# Patient Record
Sex: Male | Born: 1995 | Hispanic: No | Marital: Single | Smoking: Current every day smoker
Health system: Southern US, Community
[De-identification: ages and names within clinical notes are randomized; demographics above are authoritative.]

## PROBLEM LIST (undated history)

## (undated) DIAGNOSIS — F209 Schizophrenia, unspecified: Secondary | ICD-10-CM

---
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
## 2013-02-20 ENCOUNTER — Emergency Department (HOSPITAL_COMMUNITY): Payer: Self-pay

## 2013-02-20 ENCOUNTER — Emergency Department (HOSPITAL_COMMUNITY)
Admission: EM | Admit: 2013-02-20 | Discharge: 2013-02-20 | Disposition: A | Discharge status: Home / Self Care | Payer: Self-pay | Attending: Emergency Medicine | Admitting: Emergency Medicine

## 2013-02-20 ENCOUNTER — Encounter (HOSPITAL_COMMUNITY): Payer: Self-pay | Admitting: *Deleted

## 2013-02-20 DIAGNOSIS — K219 Gastro-esophageal reflux disease without esophagitis: Secondary | ICD-10-CM | POA: Insufficient documentation

## 2013-02-20 DIAGNOSIS — F17200 Nicotine dependence, unspecified, uncomplicated: Secondary | ICD-10-CM | POA: Insufficient documentation

## 2013-02-20 DIAGNOSIS — R1011 Right upper quadrant pain: Secondary | ICD-10-CM | POA: Insufficient documentation

## 2013-02-20 LAB — CBC WITH DIFFERENTIAL/PLATELET
Basophils Relative: 0 % (ref 0–1)
Eosinophils Absolute: 0.1 10*3/uL (ref 0.0–0.7)
MCH: 30.3 pg (ref 26.0–34.0)
MCHC: 37.4 g/dL — ABNORMAL HIGH (ref 30.0–36.0)
Neutrophils Relative %: 49 % (ref 43–77)
Platelets: 243 10*3/uL (ref 150–400)

## 2013-02-20 LAB — LIPASE, BLOOD: Lipase: 24 U/L (ref 11–59)

## 2013-02-20 LAB — COMPREHENSIVE METABOLIC PANEL
ALT: 20 U/L (ref 0–53)
Albumin: 4.5 g/dL (ref 3.5–5.2)
Alkaline Phosphatase: 74 U/L (ref 39–117)
Potassium: 3.3 mEq/L — ABNORMAL LOW (ref 3.5–5.1)
Sodium: 130 mEq/L — ABNORMAL LOW (ref 135–145)
Total Protein: 8.3 g/dL (ref 6.0–8.3)

## 2013-02-20 MED ORDER — FAMOTIDINE IN NACL 20-0.9 MG/50ML-% IV SOLN
20.0000 mg | Freq: Once | INTRAVENOUS | Status: AC
  Administered 2013-02-20: 20 mg via INTRAVENOUS
  Filled 2013-02-20: qty 50

## 2013-02-20 MED ORDER — POTASSIUM CHLORIDE CRYS ER 20 MEQ PO TBCR
40.0000 meq | EXTENDED_RELEASE_TABLET | Freq: Once | ORAL | Status: AC
  Administered 2013-02-20: 40 meq via ORAL
  Filled 2013-02-20: qty 2

## 2013-02-20 MED ORDER — SUCRALFATE 1 G PO TABS: 1.0000 g | ORAL_TABLET | Freq: Four times a day (QID) | ORAL | Status: DC

## 2013-02-20 MED ORDER — FAMOTIDINE 20 MG PO TABS: 20.0000 mg | ORAL_TABLET | Freq: Two times a day (BID) | ORAL | Status: DC

## 2013-02-20 MED ORDER — SODIUM CHLORIDE 0.9 % IV SOLN: INTRAVENOUS | Status: DC

## 2013-02-20 MED ORDER — SODIUM CHLORIDE 0.9 % IV BOLUS (SEPSIS)
1000.0000 mL | Freq: Once | INTRAVENOUS | Status: AC
  Administered 2013-02-20: 1000 mL via INTRAVENOUS

## 2013-02-20 MED ORDER — SODIUM CHLORIDE 0.9 % IV BOLUS (SEPSIS): 1000.0000 mL | Freq: Once | INTRAVENOUS | Status: DC

## 2013-02-20 MED ORDER — ONDANSETRON HCL 4 MG/2ML IJ SOLN
4.0000 mg | Freq: Once | INTRAMUSCULAR | Status: AC
  Administered 2013-02-20: 4 mg via INTRAVENOUS
  Filled 2013-02-20: qty 2

## 2013-02-20 NOTE — ED Notes (Signed)
Pt states vomiting Wed and Thursday. States heaving today. Also states pain to right lower rib area. Pt ate breakfast at 0630 this morning but denies vomiting, only heaving.

## 2013-02-20 NOTE — ED Provider Notes (Signed)
CSN: [id number]     Arrival date & time 02/20/13  4098 History  This chart was scribed for Toy Baker, MD by Karle Plumber and Ardelia Mems, ED Scribes. This patient was seen in room APA08/APA08 and the patient's care was started at 9:08 AM.    Chief Complaint  Patient presents with  . Emesis   The history is provided by the patient. No language interpreter was used.   HPI Comments:  William [name] is a 17 y.o. male without significant PMH who presents to the Emergency Department complaining of constant , mild, RUQ abdominal pain onset 2 days ago. He reports associated nausea and several episodes of emesis for the past two days. Pt reports eating and drinking make the emesis worse ( especially spicy foods), however, he states that certain foods do not seem to be worse than others. Pt reports taking an ibuprofen 800 mg 2 days ago without relief of pain.  He denies having any history of similar abdominal pain. He denies any surgical abdominal history. He denies fever, diarrhea, dysuria, frequency or any other symptoms. Pt is a current every day smoker and denies alcohol use.   History reviewed. No pertinent past medical history.  History reviewed. No pertinent past surgical history.  No family history on file.  History  Substance Use Topics  . Smoking status: Current Every Day Smoker    Types: Cigarettes  . Smokeless tobacco: Not on file     Comment: 1 pack every 2 days  . Alcohol Use: No    Review of Systems  Constitutional: Negative for fever.  Gastrointestinal: Positive for nausea and abdominal pain (RUQ). Negative for diarrhea.  Genitourinary: Negative for dysuria and frequency.  All other systems reviewed and are negative.    Allergies  Review of patient's allergies indicates no known allergies.  Home Medications  No current outpatient prescriptions on file.  Triage Vitals: BP 134/80  Pulse 104  Temp(Src) 97.4 F (36.3 C) (Oral)  Resp 16  Ht 5\' 6"  (1.676 m)   Wt 135 lb (61.236 kg)  BMI 21.8 kg/m2  SpO2 97%  Physical Exam  Nursing note and vitals reviewed. Constitutional: He is oriented to person, place, and time. He appears well-developed and well-nourished.  HENT:  Head: Normocephalic and atraumatic.  Eyes: Conjunctivae are normal.  Neck: Normal range of motion.  Cardiovascular: Normal rate, regular rhythm and normal heart sounds.   Pulmonary/Chest: Effort normal.  Abdominal: There is tenderness. There is no rebound and no guarding.  Mild mid-epigastric and RUQ tenderness without guarding or rebound.  Musculoskeletal: Normal range of motion.  Neurological: He is alert and oriented to person, place, and time.  Skin: Skin is warm and dry.    ED Course  Procedures (including critical care time)  DIAGNOSTIC STUDIES: Oxygen Saturation is 97% on RA, normal by my interpretation.    COORDINATION OF CARE: 9:14 AM- Pt advised of plan for diagnostic lab work and radiology, along with plan to receive medications in the ED and pt agrees.   Labs Review Labs Reviewed  CBC WITH DIFFERENTIAL - Abnormal; Notable for the following:    Hemoglobin 17.4 (*)    MCHC 37.4 (*)    Monocytes Relative 17 (*)    Monocytes Absolute 1.1 (*)    All other components within normal limits  COMPREHENSIVE METABOLIC PANEL - Abnormal; Notable for the following:    Sodium 130 (*)    Potassium 3.3 (*)    Chloride 87 (*)  Glucose, Bld 116 (*)    BUN 28 (*)    All other components within normal limits  LIPASE, BLOOD    Imaging Review US Abdomen Complete  02/20/2013   *RADIOLOGY REPORT*  Clinical Data:  Abdominal pain.  ABDOMINAL ULTRASOUND COMPLETE  Comparison:  CT scan of March 20, 2012.  Findings:  Gallbladder:  No gallstones, gallbladder wall thickening, or pericholecystic fluid.  Common Bile Duct:  Measures 1.9 mm which is within normal limits in caliber.  Liver: No focal mass lesion identified.  Within normal limits in parenchymal echogenicity.  IVC:   Appears normal.  Pancreas:  Pancreatic head and body appear normal.  Tail is not well visualized due to overlying bowel gas.  Spleen:  Within normal limits in size and echotexture.  Right kidney:  Measures 9.2 cm in length. Normal in size and parenchymal echogenicity.  No evidence of mass or hydronephrosis.  Left kidney:  Measures 10.3 cm length. Normal in size and parenchymal echogenicity.  No evidence of mass or hydronephrosis. Prominent left extrarenal pelvis is noted which is unchanged compared to prior CT scan.  Abdominal Aorta:  No aneurysm identified.  IMPRESSION: Negative abdominal ultrasound.   Original Report Authenticated By: Lupita Raider.,  M.D.    MDM  No diagnosis found.  Pt given meds for gerd and he feels better--repeat abd exam nl, non surgical--pt stable for d/c  I personally performed the services described in this documentation, which was scribed in my presence. The recorded information has been reviewed and is accurate.     Toy Baker, MD 02/20/13 1059

## 2014-01-23 ENCOUNTER — Emergency Department (HOSPITAL_COMMUNITY)
Admission: EM | Admit: 2014-01-23 | Discharge: 2014-01-23 | Disposition: A | Discharge status: Home / Self Care | Payer: Medicaid Other | Attending: Emergency Medicine | Admitting: Emergency Medicine

## 2014-01-23 ENCOUNTER — Encounter (HOSPITAL_COMMUNITY): Payer: Self-pay | Admitting: Emergency Medicine

## 2014-01-23 DIAGNOSIS — Y9301 Activity, walking, marching and hiking: Secondary | ICD-10-CM | POA: Insufficient documentation

## 2014-01-23 DIAGNOSIS — Z23 Encounter for immunization: Secondary | ICD-10-CM | POA: Insufficient documentation

## 2014-01-23 DIAGNOSIS — S51809A Unspecified open wound of unspecified forearm, initial encounter: Secondary | ICD-10-CM | POA: Insufficient documentation

## 2014-01-23 DIAGNOSIS — W010XXA Fall on same level from slipping, tripping and stumbling without subsequent striking against object, initial encounter: Secondary | ICD-10-CM | POA: Insufficient documentation

## 2014-01-23 DIAGNOSIS — F17200 Nicotine dependence, unspecified, uncomplicated: Secondary | ICD-10-CM | POA: Insufficient documentation

## 2014-01-23 DIAGNOSIS — IMO0002 Reserved for concepts with insufficient information to code with codable children: Secondary | ICD-10-CM

## 2014-01-23 DIAGNOSIS — Y92410 Unspecified street and highway as the place of occurrence of the external cause: Secondary | ICD-10-CM | POA: Insufficient documentation

## 2014-01-23 MED ORDER — TETANUS-DIPHTH-ACELL PERTUSSIS 5-2.5-18.5 LF-MCG/0.5 IM SUSP
0.5000 mL | Freq: Once | INTRAMUSCULAR | Status: AC
  Administered 2014-01-23: 0.5 mL via INTRAMUSCULAR
  Filled 2014-01-23: qty 0.5

## 2014-01-23 MED ORDER — MUPIROCIN CALCIUM 2 % EX CREA: 1.0000 "application " | TOPICAL_CREAM | Freq: Two times a day (BID) | CUTANEOUS | Status: DC

## 2014-01-23 MED ORDER — OXYCODONE-ACETAMINOPHEN 5-325 MG PO TABS: 1.0000 | ORAL_TABLET | Freq: Four times a day (QID) | ORAL | Status: DC | PRN

## 2014-01-23 NOTE — ED Provider Notes (Signed)
CSN: [id number]     Arrival date & time 01/23/14  1416 History  This chart was scribed for non-physician practitioner, Allen Derry, PA-C,working with Junius Argyle, MD, by Karle Plumber, ED Scribe. This patient was seen in room WTR6/WTR6 and the patient's care was started at 3:05 PM.  Chief Complaint  Patient presents with  . Laceration   Patient is a 18 y.o. male presenting with skin laceration. The history is provided by the patient. No language interpreter was used.  Laceration  HPI Comments:  William [name] is a 18 y.o. male who presents to the Emergency Department complaining of a laceration to the volar aspect of his right arm approximately 19 hours ago. Pt states he was walking down the [place], slipped and fell, cutting his arm on a metal bar. He reports the constant burning non-radiating pain at 10/10. He reports associated tingling of the fingers upon waking this morning that has since resolved. He states he applied antibiotic ointment and cleaned it with peroxide. He states he took Ibuprofen with no relief of the pain. He is right-hand dominant. He denies numbness, fever, red streaking, or warmth to the area. Pt is unaware of his last tetanus vaccination. Bleeding well controlled with pressure, and has not oozed overnight.  History reviewed. No pertinent past medical history. History reviewed. No pertinent past surgical history. No family history on file. History  Substance Use Topics  . Smoking status: Current Every Day Smoker -- 0.50 packs/day    Types: Cigarettes  . Smokeless tobacco: Not on file     Comment: 1 pack every 2 days  . Alcohol Use: Yes     Comment: 1 beer every 2 weeks    Review of Systems  Constitutional: Negative for fever.  Musculoskeletal: Negative for arthralgias and myalgias.  Skin: Positive for wound. Negative for color change.  Neurological: Negative for weakness and numbness.  Hematological: Does not bruise/bleed easily.   Psychiatric/Behavioral: Negative for self-injury.   Allergies  Review of patient's allergies indicates no known allergies.  Home Medications   Prior to Admission medications   Medication Sig Start Date End Date Taking? Authorizing Provider  ibuprofen (ADVIL,MOTRIN) 200 MG tablet Take 400 mg by mouth every 6 (six) hours as needed (pain).   Yes Historical Provider, MD  neomycin-bacitracin-polymyxin (NEOSPORIN) ophthalmic ointment 1 application 4 (four) times daily as needed (arm pain/sore).   Yes Historical Provider, MD  mupirocin cream (BACTROBAN) 2 % Apply 1 application topically 2 (two) times daily. 01/23/14   Lyllie Cobbins Strupp Camprubi-Soms, PA-C  oxyCODONE-acetaminophen (PERCOCET) 5-325 MG per tablet Take 1-2 tablets by mouth every 6 (six) hours as needed for severe pain. 01/23/14   Ishana Blades Strupp Camprubi-Soms, PA-C   .Triage Vitals: BP 128/77  Pulse 84  Temp(Src) 98 F (36.7 C) (Oral)  Resp 16  SpO2 98% Physical Exam  Nursing note and vitals reviewed. Constitutional: He is oriented to person, place, and time. Vital signs are normal. He appears well-developed and well-nourished. No distress.  VSS  HENT:  Head: Normocephalic and atraumatic.  Mouth/Throat: Mucous membranes are normal.  Eyes: Conjunctivae and EOM are normal.  Neck: Normal range of motion. Neck supple.  Cardiovascular: Normal rate and intact distal pulses.   Intact distal pulses  Pulmonary/Chest: Effort normal. No respiratory distress.  Abdominal: Normal appearance. He exhibits no distension.  Musculoskeletal: Normal range of motion.       Right elbow: Normal.      Right wrist: Normal.  Right forearm: He exhibits tenderness and laceration.       Arms:      Right hand: Normal. Normal sensation noted. Normal strength noted.  Laceration over volar aspect of R forearm, as noted above. R elbow and wrist with FROM intact, no exposed tendons or vasculature. Strength 5/5 in all extremities. Sensation grossly intact  in all extremities. Moving all digits. Cap refill <3 secs. Distal pulses intact.   Neurological: He is alert and oriented to person, place, and time. He has normal strength. No sensory deficit.  Strength 5/5 in all extremities. Sensation grossly intact in all extremities. Moving all digits.   Skin: Skin is warm and dry. Abrasion and laceration noted.  Laceration over volar aspect of R forearm, extending from just proximal to the wrist, diagonally down to approx mid-forearm, approx 12cm in length with proximal lac opened to a width of approx 2cm. Several abrasions to the edges of the wound. Bleeding well controlled. Wound is superficial layers, not extending past subQ fat. No exposed tendons or vessels.  Psychiatric: He has a normal mood and affect. His behavior is normal.    ED Course  Procedures (including critical care time) DIAGNOSTIC STUDIES: Oxygen Saturation is 98% on RA, normal by my interpretation.   COORDINATION OF CARE: 3:15 PM- Will prescribe antibiotic ointment and dress wound. Informed pt that since the incident occurred more than 12 hours ago that sutures were not advised. Pt verbalizes understanding and agrees to plan.  Medications  Tdap (BOOSTRIX) injection 0.5 mL (0.5 mLs Intramuscular Given 01/23/14 1529)    Labs Review Labs Reviewed - No data to display  Imaging Review No results found.   EKG Interpretation None      MDM   Final diagnoses:  Laceration    Lac >12 hrs PTA, bleeding controlled, and very superficial. Discussed that we'll need to heal via secondary intention due to the timing of his presentation. Discussed with the patient keeping the wound clean with warm soapy water twice a day, changing the dressing and placing antibiotic ointment underneath then reapplying the gauze dressing. Discussed that he needs to change this dressing more often if he is working in the sun and dressing becomes saturated with sweat. Return precautions given, including signs  for infection. Will not start oral antibiotics given that the patient is healthy, but discussed wound check in 2 days and at that time if antibiotics are deemed appropriate, he can start them. Rx for Bactroban given. Tetanus updated.  I personally performed the services described in this documentation, which was scribed in my presence. The recorded information has been reviewed and is accurate.  BP 128/77  Pulse 84  Temp(Src) 98 F (36.7 C) (Oral)  Resp 16  SpO2 98%  Meds ordered this encounter  Medications  . Tdap (BOOSTRIX) injection 0.5 mL    Sig:   . mupirocin cream (BACTROBAN) 2 %    Sig: Apply 1 application topically 2 (two) times daily.    Dispense:  15 g    Refill:  0    Order Specific Question:  Supervising Provider    Answer:  Eber HongMILLER, BRIAN D [[id number]]  . oxyCODONE-acetaminophen (PERCOCET) 5-325 MG per tablet    Sig: Take 1-2 tablets by mouth every 6 (six) hours as needed for severe pain.    Dispense:  6 tablet    Refill:  0    Order Specific Question:  Supervising Provider    Answer:  Eber HongMILLER, BRIAN D [[id number]]  [place], [place] 01/23/14 [phone]

## 2014-01-23 NOTE — ED Notes (Addendum)
Pt reports walking on sidewalk and fell resulting in laceration to right forearm yesterday. Pt reports using antibiotic ointment and peroxide for pain. Pt reports worsening pain since. Pt denies numbness/tingling and able to move fingers freely. Pt right radial pulse moderate.  Pt laceration measures 10 cm long. Pt has horizontal 2 x 1 cm laceration within top portion of 10 cm laceration.   PA made aware.

## 2014-01-23 NOTE — Discharge Instructions (Signed)
Keep wound clean and dry. Apply bactroban ointment with each dressing change, twice daily or more if dressing becomes saturated with sweat or dirty. Take Percocet for severe pain as directed, as needed for pain but do not drive or operate machinery with pain medication use. [facility] doctor in 2 days for wound recheck.  Return to emergency department for emergent changing or worsening symptoms.   Laceration Care, Adult A laceration is a cut that goes through all layers of the skin. The cut goes into the tissue beneath the skin. HOME CARE For stitches (sutures) or staples:  Keep the cut clean and dry.  If you have a bandage (dressing), change it at least once a day. Change the bandage if it gets wet or dirty, or as told by your doctor.  Wash the cut with soap and water 2 times a day. Rinse the cut with water. Pat it dry with a clean towel.  Put a thin layer of medicated cream on the cut as told by your doctor.  You may shower after the first 24 hours. Do not soak the cut in water until the stitches are removed.  Only take medicines as told by your doctor.  Have your stitches or staples removed as told by your doctor. For skin adhesive strips:  Keep the cut clean and dry.  Do not get the strips wet. You may take a bath, but be careful to keep the cut dry.  If the cut gets wet, pat it dry with a clean towel.  The strips will fall off on their own. Do not remove the strips that are still stuck to the cut. For wound glue:  You may shower or take baths. Do not soak or scrub the cut. Do not swim. Avoid heavy sweating until the glue falls off on its own. After a shower or bath, pat the cut dry with a clean towel.  Do not put medicine on your cut until the glue falls off.  If you have a bandage, do not put tape over the glue.  Avoid lots of sunlight or tanning lamps until the glue falls off. Put sunscreen on the cut for the first year to reduce  your scar.  The glue will fall off on its own. Do not pick at the glue. You may need a tetanus shot if:  You cannot remember when you had your last tetanus shot.  You have never had a tetanus shot. If you need a tetanus shot and you choose not to have one, you may get tetanus. Sickness from tetanus can be serious. GET HELP RIGHT AWAY IF:   Your pain does not get better with medicine.  Your arm, hand, leg, or foot loses feeling (numbness) or changes color.  Your cut is bleeding.  Your joint feels weak, or you cannot use your joint.  You have painful lumps on your body.  Your cut is red, puffy (swollen), or painful.  You have a red line on the skin near the cut.  You have yellowish-white fluid (pus) coming from the cut.  You have a fever.  You have a bad smell coming from the cut or bandage.  Your cut breaks open before or after stitches are removed.  You notice something coming out of the cut, such as wood or glass.  You cannot move a finger or toe. MAKE SURE YOU:   Understand these instructions.  Will watch your condition.  Will get help right  away if you are not doing well or get worse. Document Released: 11/28/2007 Document Revised: 09/03/2011 Document Reviewed: 12/05/2010 [facility] Patient Information 2015 [place], [place]. This information is not intended to replace advice given to you by your health care provider. Make sure you discuss any questions you have with your health care provider.  Emergency Department Resource Guide 1) Find a Doctor and Pay Out of Pocket Although you won't have to find out who is covered by your insurance plan, it is a good idea to ask around and get recommendations. You will then need to call the office and see if the doctor you have chosen will accept you as a new patient and what types of options they offer for patients who are self-pay. Some doctors offer discounts or will set up payment plans for their patients who do not have  insurance, but you will need to ask so you aren't surprised when you get to your appointment.  2) Contact Your Local Health Department Not all health departments have doctors that can see patients for sick visits, but many do, so it is worth a call to see if yours does. If you don't know where your local health department is, you can check in your phone book. The CDC also has a tool to help you locate your state's health department, and many state websites also have listings of all of their local health departments.  3) Find a Walk-in Clinic If your illness is not likely to be very severe or complicated, you may want to try a walk in clinic. These are popping up all over the country in pharmacies, drugstores, and shopping centers. They're usually staffed by nurse practitioners or physician assistants that have been trained to treat common illnesses and complaints. They're usually fairly quick and inexpensive. However, if you have serious medical issues or chronic medical problems, these are probably not your best option.  No Primary Care Doctor: - Call Health Connect at  [phone] - they can help you locate a primary care doctor that  accepts your insurance, provides certain services, etc. - Physician Referral Service- [phone]  Chronic Pain Problems: Organization         Address  Phone   Notes  [facility]  [phone] Patients need to be referred by their primary care doctor.   Medication Assistance: Organization         Address  Phone   Notes  [facility] Medication [facility] [address] [place], [place] [address] [phone] --Must be a resident of [facility] -- Must have NO insurance coverage whatsoever (no Medicaid/ Medicare, etc.) -- The pt. MUST have a primary care doctor that directs their care regularly and follows them in the community   [facility]ist  [phone]   [employer]  [phone]    Agencies that provide  inexpensive medical care: Organization         Address  Phone   Notes  [facility]  [phone]   [facility]    [phone]   [facility] [address] [place], [place] [address] [phone]   [facility] [address] [place] [place] [phone]   Planned Parenthood    [phone]   [facility]    [phone]   [facility]  [address], [place] Phone:  [phone], Fax:  [phone] Hours  of Operation:  9 am - 6 pm, M-F.  Also accepts Medicaid/Medicare and self-pay.  [facility]  [address], [place] Phone: [phone]) [phone], Fax: [phone]. Hours of Operation:  8:30 am - 5:30 pm, M-F.  Also accepts Medicaid and self-pay.  [facility] [place] [address], [place] Phone: [phone](336) [phone]   [facility] [address], [place], [place] [phone], Ext. [phone] Mondays & Thursdays: 7-9 AM.  First 15 patients are seen on a first come, first serve basis.    Medicaid-accepting [facility] Providers:  Organization         Address  Phone   Notes  [facility] [address], [place] [phone] Also accepts self-pay patients.  [facility] [address], [place]  [phone]) [phone]   [facility] [address], [place] [phone]) [phone]   [facility] [address] [place] [address], [place] [phone]   Renaye RakersVeita Bland [address], [place]   [phone] Only accepts [place] Access [place] patients after they have their name applied to their card.   Self-Pay (no insurance) in [facility]:  Organization         Address  Phone   Notes  Sickle Cell Patients, [facility] [address], [place] [phone]) [phone]   [facility] [address], [place] [phone]) [phone]   [facility]  [address], [place] [phone]   [facility]/Dr. Osei-Bonsu  [address] [place] [address], [place] or [phone], [place] [phone] Phone number for both [place] and [place] locations is the same.  [facility]gent [facility] and [facility] [address], [place] [phone]   [facility] [place] [address] [place] [address], [place] or [address] [phone]   [facility] [address] [place] [address], [place] [phone]) [phone], phone; [phone](336) [phone], fax Sees patients 1st and 3rd Saturday of every month.  Must not qualify for public or private insurance (i.e. Medicaid, Medicare, [place] Health Choice, Veterans' Benefits)  Household income should be no more than 200% of the poverty level The clinic cannot treat you if you are pregnant or think you are pregnant  Sexually transmitted diseases are not treated at the clinic.    Dental Care: Organization         Address  Phone  Notes  [facility] [address], [place] [phone] Accepts children up to age 20 who are enrolled in [place] or [place] Health Choice; pregnant women with a Medicaid card; and children who have applied for Medicaid or [place] Health Choice, but were declined, whose parents can pay a reduced fee at time of service.  [facility] [place]  [address], [place] [phone]) [phone] Accepts children up to age 20 who are enrolled in [place] or [place] Health Choice; pregnant women with a Medicaid card; and children who have applied for Medicaid or [place] Health Choice, but were declined, whose parents can pay a reduced fee at time of service.  [facility]  [address], [place] [phone] Patients are seen by appointment only. Walk-ins are  not accepted. [facility] will see  patients 56 years of age and older. Monday - Tuesday (8am-5pm) Most Wednesdays (8:30-5pm) $30 per visit, cash only  [facility] Access PRO[facility]  [address], [facility] [phone] Patients are seen by appointment only. Walk-ins are not accepted. [facility] will see patients 13 years of age and older. One Wednesday Evening (Monthly: Volunteer Based).  $30 per visit, cash only  [employer]  [phone] for adults; Children under age 68, call Graduate Pediatric Dentistry at [phone]. Children aged 70-14, please call [phone] to request a pediatric application.  Dental services are provided in all areas of dental care including fillings, crowns and bridges, complete and partial dentures, implants, gum treatment, root canals, and extractions. Preventive care is also provided. Treatment is provided to both adults and children. Patients are selected via a lottery and there is often a waiting list.   [facility] [address], [place]  [phone] [email or web address]   [facility] [address] [place], [place] [phone], Ext. [phone] Second and Fourth Thursday of each month, opens at 6:30 AM; Clinic ends at 9 AM.  Patients are seen on a first-come first-served basis, and a limited number are seen during each clinic.   [facility]  [address] [place], [place] [phone]   Eligibility Requirements You must have lived in [place], [place], or [place] counties for at least the last three months.   You cannot be eligible for state or federal sponsored [place], including [employer], [place], or [employer].   You generally cannot be eligible for healthcare insurance through your employer.    How to apply: Eligibility screenings are held every Tuesday and Wednesday afternoon from 1:00 pm until 4:00 pm. You do not need an appointment for  the interview!  [facility] [address] [place], [place] [phone]   [facility]  [phone]   [facility]  [phone]   [facility] Health Department  [phone]    Behavioral Health Resources in the Community: Intensive Outpatient Programs Organization         Address  Phone  Notes  [facility] [address] [place], [place] [phone]   [facility]patient [address] [place], [place] [phone]   [facility] [address], [place], [place]  [phone]   [facility] [address] ,  [place], [place] [phone] or [phone]   Substance Abuse Resources Organization         Address  Phone  Notes  [facility] Drug Services  [phone]   [facility]  [phone]   [facility] [place]  [phone]   Floydene Flock  [phone]   Residential & Outpatient Substance Abuse Program  7320046262   Psychological Services Organization         Address  Phone  Notes  [facility]  [phone]   [facility]  [phone]   [facility] [address], [place] [phone] or [phone]    Mobile Crisis Teams Organization         Address  Phone  Notes  [facility], Mobile Crisis Care Unit  6018830087   [facility]  [address] [place], [place] [phone]   [facility] [address] [place] [phone]    Self-Help/Support Groups Organization  Address  Phone             Notes  [facility] of [place] - variety of support groups  [phone]- I7437963 Call for more information  Narcotics Anonymous (NA), [facility] [address], [employer] [place]  2 meetings at this location   Statistician         Address  Phone  Notes  [facility]  [address],    [place]  [phone]   [facility]  [address] [place] [address], [place], [place] [phone]   [facility] [address] [place], [place] [phone] Admissions: 8am-3pm M-F  [facility] [address],    [place], [place] [phone]   [facility] [address] [place], [place], [place] [phone]   [facility] [address]  [place], [place] [phone]   [facility] - Intensive Outpatient [address], [place], [place] [phone]   [facility] ([facility] [address]  [place], [place] [phone] or [phone]   [facility] ([facility]) [address] [place], [place] [phone] Accepts Medicaid  [facility] [address],  [place] [phone] Substance Abuse/Addiction Treatment   [facility] Organization         Address  Phone  Notes  [facility]  2030712272   Angie Fava, PhD [address] [place], [place]   [phone] or [phone]   [facility]al   [address]  [place], [place] [phone]   [facility] [address] [place], [place] [phone] Insurance/Medicaid/sponsorship through [facility] Families [address]                                    [place], [place] [phone] Therapy/tele-psych/case  [facility] [address], [place] [phone]    Dr. Lolly Mustache  [phone]   [facility]  [employer] [place] [facility] Dept. 1) [address] [place] 2) [address] [place] 3)  [address], [place] [phone]  [phone]   [facility] Child Abuse Hotline (938) 559-3164 or [phone] (After Hours)

## 2014-01-24 NOTE — ED Provider Notes (Signed)
Medical screening examination/treatment/procedure(s) were performed by non-physician practitioner and as supervising physician I was immediately available for consultation/collaboration.   EKG Interpretation None        Junius ArgyleForrest S Ecko Beasley, MD 01/24/14 [phone]

## 2014-06-25 HISTORY — PX: CHEST TUBE INSERTION: SHX231

## 2014-08-27 ENCOUNTER — Emergency Department (HOSPITAL_COMMUNITY): Payer: No Typology Code available for payment source

## 2014-08-27 ENCOUNTER — Emergency Department (HOSPITAL_COMMUNITY)
Admission: EM | Admit: 2014-08-27 | Discharge: 2014-08-27 | Disposition: A | Discharge status: Home / Self Care | Payer: No Typology Code available for payment source | Attending: Emergency Medicine | Admitting: Emergency Medicine

## 2014-08-27 ENCOUNTER — Encounter (HOSPITAL_COMMUNITY): Payer: Self-pay | Admitting: Family Medicine

## 2014-08-27 DIAGNOSIS — S3992XA Unspecified injury of lower back, initial encounter: Secondary | ICD-10-CM | POA: Diagnosis present

## 2014-08-27 DIAGNOSIS — Y92410 Unspecified street and highway as the place of occurrence of the external cause: Secondary | ICD-10-CM | POA: Diagnosis not present

## 2014-08-27 DIAGNOSIS — Z792 Long term (current) use of antibiotics: Secondary | ICD-10-CM | POA: Insufficient documentation

## 2014-08-27 DIAGNOSIS — Z720 Tobacco use: Secondary | ICD-10-CM | POA: Diagnosis not present

## 2014-08-27 DIAGNOSIS — S199XXA Unspecified injury of neck, initial encounter: Secondary | ICD-10-CM | POA: Diagnosis not present

## 2014-08-27 DIAGNOSIS — Y9389 Activity, other specified: Secondary | ICD-10-CM | POA: Diagnosis not present

## 2014-08-27 DIAGNOSIS — Y998 Other external cause status: Secondary | ICD-10-CM | POA: Diagnosis not present

## 2014-08-27 DIAGNOSIS — S0990XA Unspecified injury of head, initial encounter: Secondary | ICD-10-CM | POA: Insufficient documentation

## 2014-08-27 DIAGNOSIS — R000 Tachycardia, unspecified: Secondary | ICD-10-CM | POA: Insufficient documentation

## 2014-08-27 MED ORDER — IBUPROFEN 800 MG PO TABS: 800.0000 mg | ORAL_TABLET | Freq: Three times a day (TID) | ORAL | Status: DC | PRN

## 2014-08-27 MED ORDER — METHOCARBAMOL 500 MG PO TABS: 500.0000 mg | ORAL_TABLET | Freq: Three times a day (TID) | ORAL | Status: DC | PRN

## 2014-08-27 MED ORDER — OXYCODONE-ACETAMINOPHEN 5-325 MG PO TABS
1.0000 | ORAL_TABLET | Freq: Once | ORAL | Status: AC
  Administered 2014-08-27: 1 via ORAL
  Filled 2014-08-27: qty 1
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
## 2014-08-27 NOTE — Discharge Instructions (Signed)
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
## 2014-10-20 ENCOUNTER — Emergency Department (HOSPITAL_COMMUNITY)
Admission: EM | Admit: 2014-10-20 | Discharge: 2014-10-20 | Disposition: A | Discharge status: Home / Self Care | Payer: Medicaid Other | Attending: Emergency Medicine | Admitting: Emergency Medicine

## 2014-10-20 ENCOUNTER — Encounter (HOSPITAL_COMMUNITY): Payer: Self-pay | Admitting: Emergency Medicine

## 2014-10-20 DIAGNOSIS — K088 Other specified disorders of teeth and supporting structures: Secondary | ICD-10-CM | POA: Insufficient documentation

## 2014-10-20 DIAGNOSIS — K029 Dental caries, unspecified: Secondary | ICD-10-CM

## 2014-10-20 DIAGNOSIS — Z720 Tobacco use: Secondary | ICD-10-CM | POA: Insufficient documentation

## 2014-10-20 DIAGNOSIS — R51 Headache: Secondary | ICD-10-CM | POA: Insufficient documentation

## 2014-10-20 DIAGNOSIS — K0889 Other specified disorders of teeth and supporting structures: Secondary | ICD-10-CM

## 2014-10-20 MED ORDER — NAPROXEN SODIUM 220 MG PO TABS: 440.0000 mg | ORAL_TABLET | Freq: Two times a day (BID) | ORAL | Status: DC

## 2014-10-20 MED ORDER — PENICILLIN V POTASSIUM 500 MG PO TABS: 500.0000 mg | ORAL_TABLET | Freq: Four times a day (QID) | ORAL | Status: DC

## 2014-10-20 MED ORDER — OXYCODONE-ACETAMINOPHEN 5-325 MG PO TABS
1.0000 | ORAL_TABLET | Freq: Once | ORAL | Status: AC
  Administered 2014-10-20: 1 via ORAL
  Filled 2014-10-20: qty 1

## 2014-10-20 MED ORDER — OXYCODONE-ACETAMINOPHEN 5-325 MG PO TABS: 1.0000 | ORAL_TABLET | ORAL | Status: DC | PRN

## 2014-10-20 NOTE — Discharge Instructions (Signed)
Read the information below.  Use the prescribed medication as directed.  Please discuss all new medications with your pharmacist.  Do not take additional tylenol while taking the prescribed pain medication to avoid overdose.  You may return to the Emergency Department at any time for worsening condition or any new symptoms that concern you.    Please call the dentist listed above within 48 hours to schedule a close follow up appointment.  If you develop fevers, swelling in your face, difficulty swallowing or breathing, return to the ER immediately for a recheck.     Dental Caries Dental caries (also called tooth decay) is the most common oral disease. It can occur at any age but is more common in children and young adults.  HOW DENTAL CARIES DEVELOPS  The process of decay begins when bacteria and foods (particularly sugars and starches) combine in your mouth to produce plaque. Plaque is a substance that sticks to the hard, outer surface of a tooth (enamel). The bacteria in plaque produce acids that attack enamel. These acids may also attack the root surface of a tooth (cementum) if it is exposed. Repeated attacks dissolve these surfaces and create holes in the tooth (cavities). If left untreated, the acids destroy the other layers of the tooth.  RISK FACTORS  Frequent sipping of sugary beverages.   Frequent snacking on sugary and starchy foods, especially those that easily get stuck in the teeth.   Poor oral hygiene.   Dry mouth.   Substance abuse such as methamphetamine abuse.   Broken or poor-fitting dental restorations.   Eating disorders.   Gastroesophageal reflux disease (GERD).   Certain radiation treatments to the head and neck. SYMPTOMS In the early stages of dental caries, symptoms are seldom present. Sometimes white, chalky areas may be seen on the enamel or other tooth layers. In later stages, symptoms may include:  Pits and holes on the enamel.  Toothache after  sweet, hot, or cold foods or drinks are consumed.  Pain around the tooth.  Swelling around the tooth. DIAGNOSIS  Most of the time, dental caries is detected during a regular dental checkup. A diagnosis is made after a thorough medical and dental history is taken and the surfaces of your teeth are checked for signs of dental caries. Sometimes special instruments, such as lasers, are used to check for dental caries. Dental X-ray exams may be taken so that areas not visible to the eye (such as between the contact areas of the teeth) can be checked for cavities.  TREATMENT  If dental caries is in its early stages, it may be reversed with a fluoride treatment or an application of a remineralizing agent at the dental office. Thorough brushing and flossing at home is needed to aid these treatments. If it is in its later stages, treatment depends on the location and extent of tooth destruction:   If a small area of the tooth has been destroyed, the destroyed area will be removed and cavities will be filled with a material such as gold, silver amalgam, or composite resin.   If a large area of the tooth has been destroyed, the destroyed area will be removed and a cap (crown) will be fitted over the remaining tooth structure.   If the center part of the tooth (pulp) is affected, a procedure called a root canal will be needed before a filling or crown can be placed.   If most of the tooth has been destroyed, the tooth may need  to be pulled (extracted). HOME CARE INSTRUCTIONS You can prevent, stop, or reverse dental caries at home by practicing good oral hygiene. Good oral hygiene includes:  Thoroughly cleaning your teeth at least twice a day with a toothbrush and dental floss.   Using a fluoride toothpaste. A fluoride mouth rinse may also be used if recommended by your dentist or health care provider.   Restricting the amount of sugary and starchy foods and sugary liquids you consume.   Avoiding  frequent snacking on these foods and sipping of these liquids.   Keeping regular visits with a dentist for checkups and cleanings. PREVENTION   Practice good oral hygiene.  Consider a dental sealant. A dental sealant is a coating material that is applied by your dentist to the pits and grooves of teeth. The sealant prevents food from being trapped in them. It may protect the teeth for several years.  Ask about fluoride supplements if you live in a community without fluorinated water or with water that has a low fluoride content. Use fluoride supplements as directed by your dentist or health care provider.  Allow fluoride varnish applications to teeth if directed by your dentist or health care provider. Document Released: 03/03/2002 Document Revised: 10/26/2013 Document Reviewed: 06/13/2012 [facility] Patient Information 2015 [place], [place]. This information is not intended to replace advice given to you by your health care provider. Make sure you discuss any questions you have with your health care provider.  Dental Pain A tooth ache may be caused by cavities (tooth decay). Cavities expose the nerve of the tooth to air and hot or cold temperatures. It may come from an infection or abscess (also called a boil or furuncle) around your tooth. It is also often caused by dental caries (tooth decay). This causes the pain you are having. DIAGNOSIS  Your caregiver can diagnose this problem by exam. TREATMENT   If caused by an infection, it may be treated with medications which kill germs (antibiotics) and pain medications as prescribed by your caregiver. Take medications as directed.  Only take over-the-counter or prescription medicines for pain, discomfort, or fever as directed by your caregiver.  Whether the tooth ache today is caused by infection or dental disease, you should see your dentist as soon as possible for further care. SEEK MEDICAL CARE IF: The exam and treatment you received today  has been provided on an emergency basis only. This is not a substitute for complete medical or dental care. If your problem worsens or new problems (symptoms) appear, and you are unable to meet with your dentist, call or return to this location. SEEK IMMEDIATE MEDICAL CARE IF:   You have a fever.  You develop redness and swelling of your face, jaw, or neck.  You are unable to open your mouth.  You have severe pain uncontrolled by pain medicine. MAKE SURE YOU:   Understand these instructions.  Will watch your condition.  Will get help right away if you are not doing well or get worse. Document Released: 06/11/2005 Document Revised: 09/03/2011 Document Reviewed: 01/28/2008 [facility] Patient Information 2015 [place], [place]. This information is not intended to replace advice given to you by your health care provider. Make sure you discuss any questions you have with your health care provider.

## 2014-10-20 NOTE — ED Notes (Addendum)
Pt reports right dental pain onset yesterday. Pt reports dental appointment next week scheduled.

## 2014-10-20 NOTE — ED Provider Notes (Signed)
CSN: [id number]     Arrival date & time 10/20/14  1721 History  This chart was scribed for non-physician provider Trixie DredgeEmily Aeson Sawyers, PA-C, working with Nelva Nayobert Beaton, MD by Phillis HaggisGabriella Gaje, ED Scribe. This patient was seen in room WTR9/WTR9 and patient care was started at 7:37 PM.    Chief Complaint  Patient presents with  . Dental Pain   Patient is a 19 y.o. male presenting with tooth pain. The history is provided by the patient. No language interpreter was used.  Dental Pain Associated symptoms: headaches   Associated symptoms: no fever and no neck pain   HPI Comments: William [name] is a 19 y.o. male who presents to the Emergency Department complaining of right upper dental pain onset yesterday evening. He states that he was eating when he believe his tooth chipped, yielding immediate pain. He reports that it is progressively getting worse and an associated generalized, throbbing headache. He states that he called a dentist for an appointment next week. He denies fever, chills, nausea, vomiting, chest pain, SOB,or sore throat. He reports taking tylenol with no relief.   History reviewed. No pertinent past medical history. History reviewed. No pertinent past surgical history. No family history on file. History  Substance Use Topics  . Smoking status: Current Every Day Smoker -- 0.50 packs/day    Types: Cigarettes  . Smokeless tobacco: Not on file     Comment: 1 pack every 2 days  . Alcohol Use: Yes     Comment: 1 beer every 2 weeks    Review of Systems  Constitutional: Negative for fever and chills.  HENT: Positive for dental problem. Negative for sore throat and trouble swallowing.   Respiratory: Negative for shortness of breath.   Cardiovascular: Negative for chest pain.  Gastrointestinal: Negative for nausea and vomiting.  Musculoskeletal: Negative for neck pain and neck stiffness.  Skin: Negative for rash.  Allergic/Immunologic: Negative for immunocompromised state.  Neurological:  Positive for headaches.  Hematological: Does not bruise/bleed easily.  Psychiatric/Behavioral: Negative for self-injury.   Allergies  Hydrocodone; Motrin; and Tramadol  Home Medications   Prior to Admission medications   Medication Sig Start Date End Date Taking? Authorizing Provider  ibuprofen (ADVIL,MOTRIN) 800 MG tablet Take 1 tablet (800 mg total) by mouth every 8 (eight) hours as needed for moderate pain. 08/27/14   Fayrene HelperBowie Tran, PA-C  methocarbamol (ROBAXIN) 500 MG tablet Take 1 tablet (500 mg total) by mouth every 8 (eight) hours as needed for muscle spasms. 08/27/14   Fayrene HelperBowie Tran, PA-C  mupirocin cream (BACTROBAN) 2 % Apply 1 application topically 2 (two) times daily. Patient not taking: Reported on 08/27/2014 01/23/14   Mercedes Camprubi-Soms, PA-C  oxyCODONE-acetaminophen (PERCOCET) 5-325 MG per tablet Take 1-2 tablets by mouth every 6 (six) hours as needed for severe pain. Patient not taking: Reported on 08/27/2014 01/23/14   Mercedes Camprubi-Soms, PA-C   BP 106/63 mmHg  Pulse 79  Temp(Src) 98 F (36.7 C) (Oral)  Resp 16  SpO2 100%   Physical Exam  Constitutional: He appears well-developed and well-nourished. No distress.  HENT:  Head: Normocephalic and atraumatic.  Mouth/Throat: Uvula is midline and oropharynx is clear and moist. Mucous membranes are not dry. No uvula swelling. No oropharyngeal exudate, posterior oropharyngeal edema, posterior oropharyngeal erythema or tonsillar abscesses.  Right upper first molar tenderness to percussion; deep decay; no right facial swelling; adjacent gingival tenderness  Neck: Normal range of motion. Neck supple.  Cardiovascular: Normal rate.   Pulmonary/Chest: Effort normal and  breath sounds normal. No stridor.  Lymphadenopathy:    He has no cervical adenopathy.  Neurological: He is alert.  Skin: He is not diaphoretic.  Nursing note and vitals reviewed.   ED Course  Procedures (including critical care time) DIAGNOSTIC STUDIES: Oxygen  Saturation is 100% on room air, normal by my interpretation.    COORDINATION OF CARE: 7:44 PM-Discussed treatment plan which includes pain medication with pt at bedside and pt agreed to plan.   Labs Review Labs Reviewed - No data to display  Imaging Review No results found.   EKG Interpretation None      MDM   Final diagnoses:  Pain, dental  Dental caries    Afebrile, nontoxic patient with new dental pain.  No obvious abscess but significant tenderness.  No concerning findings on exam.  Deep decay of single molar on right upper jaw.  Doubt deep space head or neck infection.  Doubt Ludwig's angina.  D/C home with antibiotic, pain medication and dental follow up.  Discussed findings, treatment, and follow up  with patient.  Pt given return precautions.  Pt verbalizes understanding and agrees with plan.       I personally performed the services described in this documentation, which was scribed in my presence. The recorded information has been reviewed and is accurate.    Trixie Dredge, PA-C 10/20/14 1958  Nelva Nay, MD 10/21/14 [phone]

## 2015-01-15 ENCOUNTER — Emergency Department (HOSPITAL_COMMUNITY): Payer: Self-pay

## 2015-01-15 ENCOUNTER — Emergency Department (HOSPITAL_COMMUNITY): Payer: MEDICAID

## 2015-01-15 ENCOUNTER — Encounter (HOSPITAL_COMMUNITY): Payer: Self-pay | Admitting: Emergency Medicine

## 2015-01-15 ENCOUNTER — Inpatient Hospital Stay (HOSPITAL_COMMUNITY)
Admission: EM | Admit: 2015-01-15 | Discharge: 2015-01-23 | DRG: 908 | Disposition: A | Discharge status: Home / Self Care | Payer: Self-pay | Attending: Surgery | Admitting: Surgery

## 2015-01-15 ENCOUNTER — Other Ambulatory Visit: Payer: Self-pay

## 2015-01-15 DIAGNOSIS — D62 Acute posthemorrhagic anemia: Secondary | ICD-10-CM | POA: Diagnosis present

## 2015-01-15 DIAGNOSIS — S20221A Contusion of right back wall of thorax, initial encounter: Secondary | ICD-10-CM | POA: Diagnosis present

## 2015-01-15 DIAGNOSIS — R1030 Lower abdominal pain, unspecified: Secondary | ICD-10-CM

## 2015-01-15 DIAGNOSIS — S0191XA Laceration without foreign body of unspecified part of head, initial encounter: Secondary | ICD-10-CM | POA: Diagnosis present

## 2015-01-15 DIAGNOSIS — S21412A Laceration without foreign body of left back wall of thorax with penetration into thoracic cavity, initial encounter: Secondary | ICD-10-CM | POA: Diagnosis present

## 2015-01-15 DIAGNOSIS — R000 Tachycardia, unspecified: Secondary | ICD-10-CM | POA: Diagnosis present

## 2015-01-15 DIAGNOSIS — R58 Hemorrhage, not elsewhere classified: Secondary | ICD-10-CM | POA: Diagnosis not present

## 2015-01-15 DIAGNOSIS — I959 Hypotension, unspecified: Secondary | ICD-10-CM | POA: Diagnosis present

## 2015-01-15 DIAGNOSIS — F10129 Alcohol abuse with intoxication, unspecified: Secondary | ICD-10-CM | POA: Diagnosis present

## 2015-01-15 DIAGNOSIS — S272XXA Traumatic hemopneumothorax, initial encounter: Secondary | ICD-10-CM | POA: Diagnosis present

## 2015-01-15 DIAGNOSIS — T1490XA Injury, unspecified, initial encounter: Secondary | ICD-10-CM

## 2015-01-15 DIAGNOSIS — F419 Anxiety disorder, unspecified: Secondary | ICD-10-CM | POA: Diagnosis present

## 2015-01-15 DIAGNOSIS — F17210 Nicotine dependence, cigarettes, uncomplicated: Secondary | ICD-10-CM | POA: Diagnosis present

## 2015-01-15 DIAGNOSIS — W458XXA Other foreign body or object entering through skin, initial encounter: Secondary | ICD-10-CM | POA: Diagnosis present

## 2015-01-15 DIAGNOSIS — Z886 Allergy status to analgesic agent status: Secondary | ICD-10-CM

## 2015-01-15 DIAGNOSIS — Z888 Allergy status to other drugs, medicaments and biological substances status: Secondary | ICD-10-CM

## 2015-01-15 DIAGNOSIS — Z885 Allergy status to narcotic agent status: Secondary | ICD-10-CM

## 2015-01-15 DIAGNOSIS — T07XXXA Unspecified multiple injuries, initial encounter: Secondary | ICD-10-CM

## 2015-01-15 DIAGNOSIS — Y907 Blood alcohol level of 200-239 mg/100 ml: Secondary | ICD-10-CM | POA: Diagnosis present

## 2015-01-15 DIAGNOSIS — J939 Pneumothorax, unspecified: Secondary | ICD-10-CM

## 2015-01-15 DIAGNOSIS — S21411A Laceration without foreign body of right back wall of thorax with penetration into thoracic cavity, initial encounter: Principal | ICD-10-CM | POA: Diagnosis present

## 2015-01-15 LAB — COMPREHENSIVE METABOLIC PANEL
ALT: 319 U/L — AB (ref 17–63)
ANION GAP: 25 — AB (ref 5–15)
AST: 204 U/L — ABNORMAL HIGH (ref 15–41)
Albumin: 3.6 g/dL (ref 3.5–5.0)
Alkaline Phosphatase: 112 U/L (ref 38–126)
BILIRUBIN TOTAL: 0.8 mg/dL (ref 0.3–1.2)
BUN: 6 mg/dL (ref 6–20)
CHLORIDE: 102 mmol/L (ref 101–111)
CO2: 15 mmol/L — AB (ref 22–32)
CREATININE: 1.69 mg/dL — AB (ref 0.61–1.24)
Calcium: 8.9 mg/dL (ref 8.9–10.3)
GFR calc Af Amer: >60 mL/min (ref >60)
GFR, EST NON AFRICAN AMERICAN: 56 mL/min — AB (ref >60)
Glucose, Bld: 190 mg/dL — ABNORMAL HIGH (ref 65–99)
Potassium: 3.1 mmol/L — ABNORMAL LOW (ref 3.5–5.1)
SODIUM: 142 mmol/L (ref 135–145)
Total Protein: 6.5 g/dL (ref 6.5–8.1)

## 2015-01-15 LAB — CBC WITH DIFFERENTIAL/PLATELET
Basophils Absolute: 0 10*3/uL (ref 0.0–0.1)
Basophils Relative: 0 % (ref 0–1)
EOS PCT: 0 % (ref 0–5)
Eosinophils Absolute: 0 10*3/uL (ref 0.0–0.7)
HEMATOCRIT: 41.7 % (ref 39.0–52.0)
Hemoglobin: 14.3 g/dL (ref 13.0–17.0)
LYMPHS ABS: 1.2 10*3/uL (ref 0.7–4.0)
Lymphocytes Relative: 6 % — ABNORMAL LOW (ref 12–46)
MCH: 28.7 pg (ref 26.0–34.0)
MCHC: 34.3 g/dL (ref 30.0–36.0)
MCV: 83.7 fL (ref 78.0–100.0)
MONO ABS: 1.3 10*3/uL — AB (ref 0.1–1.0)
Monocytes Relative: 7 % (ref 3–12)
Neutro Abs: 16.1 10*3/uL — ABNORMAL HIGH (ref 1.7–7.7)
Neutrophils Relative %: 87 % — ABNORMAL HIGH (ref 43–77)
Platelets: 202 10*3/uL (ref 150–400)
RBC: 4.98 MIL/uL (ref 4.22–5.81)
RDW: 16.5 % — AB (ref 11.5–15.5)
WBC: 18.7 10*3/uL — ABNORMAL HIGH (ref 4.0–10.5)

## 2015-01-15 LAB — I-STAT CHEM 8, ED
BUN: 7 mg/dL (ref 6–20)
CHLORIDE: 102 mmol/L (ref 101–111)
Calcium, Ion: 1 mmol/L — ABNORMAL LOW (ref 1.12–1.23)
Creatinine, Ser: 1.8 mg/dL — ABNORMAL HIGH (ref 0.61–1.24)
GLUCOSE: 164 mg/dL — AB (ref 65–99)
HEMATOCRIT: 45 % (ref 39.0–52.0)
HEMOGLOBIN: 15.3 g/dL (ref 13.0–17.0)
Potassium: 4.4 mmol/L (ref 3.5–5.1)
Sodium: 141 mmol/L (ref 135–145)
TCO2: 15 mmol/L (ref 0–100)

## 2015-01-15 LAB — PREPARE FRESH FROZEN PLASMA: Unit division: 0

## 2015-01-15 LAB — CDS SEROLOGY

## 2015-01-15 LAB — ABO/RH: ABO/RH(D): A POS

## 2015-01-15 LAB — PROTIME-INR
INR: 1.22 (ref 0.00–1.49)
Prothrombin Time: 15.6 seconds — ABNORMAL HIGH (ref 11.6–15.2)

## 2015-01-15 LAB — I-STAT CG4 LACTIC ACID, ED: LACTIC ACID, VENOUS: 16.3 mmol/L — AB (ref 0.5–2.0)

## 2015-01-15 LAB — ETHANOL: ALCOHOL ETHYL (B): 232 mg/dL — AB (ref <5)

## 2015-01-15 LAB — PREPARE RBC (CROSSMATCH)

## 2015-01-15 MED ORDER — OXYCODONE HCL 5 MG PO TABS
10.0000 mg | ORAL_TABLET | ORAL | Status: DC | PRN
  Administered 2015-01-15 – 2015-01-17 (×7): 10 mg via ORAL
  Filled 2015-01-15 (×6): qty 2

## 2015-01-15 MED ORDER — ONDANSETRON HCL 4 MG/2ML IJ SOLN
4.0000 mg | Freq: Four times a day (QID) | INTRAMUSCULAR | Status: DC | PRN
Start: 2015-01-15 — End: 2015-01-23

## 2015-01-15 MED ORDER — IOHEXOL 300 MG/ML  SOLN
100.0000 mL | Freq: Once | INTRAMUSCULAR | Status: AC | PRN
Start: 2015-01-15 — End: 2015-01-15
  Administered 2015-01-15: 100 mL via INTRAVENOUS

## 2015-01-15 MED ORDER — OXYCODONE HCL 5 MG PO TABS
5.0000 mg | ORAL_TABLET | ORAL | Status: DC | PRN
  Filled 2015-01-15 (×4): qty 1

## 2015-01-15 MED ORDER — MORPHINE SULFATE 2 MG/ML IJ SOLN
2.0000 mg | INTRAMUSCULAR | Status: DC | PRN
  Administered 2015-01-15: 2 mg via INTRAVENOUS
  Filled 2015-01-15: qty 1

## 2015-01-15 MED ORDER — SODIUM CHLORIDE 0.9 % IV SOLN
10.0000 mL/h | Freq: Once | INTRAVENOUS | Status: AC
  Administered 2015-01-15: 10 mL/h via INTRAVENOUS

## 2015-01-15 MED ORDER — OXYCODONE HCL 5 MG PO TABS: 2.5000 mg | ORAL_TABLET | ORAL | Status: DC | PRN

## 2015-01-15 MED ORDER — POTASSIUM CHLORIDE IN NACL 20-0.9 MEQ/L-% IV SOLN
INTRAVENOUS | Status: DC
  Administered 2015-01-15: 100 mL/h via INTRAVENOUS
  Filled 2015-01-15 (×3): qty 1000

## 2015-01-15 MED ORDER — MORPHINE SULFATE 2 MG/ML IJ SOLN
2.0000 mg | Freq: Once | INTRAMUSCULAR | Status: AC
  Administered 2015-01-15: 2 mg via INTRAVENOUS

## 2015-01-15 MED ORDER — HYDROMORPHONE HCL 1 MG/ML IJ SOLN
INTRAMUSCULAR | Status: AC
  Administered 2015-01-15: 1 mg via INTRAVENOUS
  Filled 2015-01-15: qty 1

## 2015-01-15 MED ORDER — HYDROMORPHONE HCL 1 MG/ML IJ SOLN
1.0000 mg | INTRAMUSCULAR | Status: DC | PRN
  Administered 2015-01-15 – 2015-01-23 (×52): 1 mg via INTRAVENOUS
  Filled 2015-01-15 (×51): qty 1

## 2015-01-15 MED ORDER — LIDOCAINE-EPINEPHRINE (PF) 2 %-1:200000 IJ SOLN
INTRAMUSCULAR | Status: AC
  Administered 2015-01-15: 40 mL
  Filled 2015-01-15: qty 40

## 2015-01-15 MED ORDER — LIDOCAINE-EPINEPHRINE (PF) 2 %-1:200000 IJ SOLN
INTRAMUSCULAR | Status: AC
  Administered 2015-01-15: 20 mL
  Filled 2015-01-15: qty 20

## 2015-01-15 MED ORDER — MORPHINE SULFATE 2 MG/ML IJ SOLN
INTRAMUSCULAR | Status: AC
  Administered 2015-01-15: 2 mg via INTRAVENOUS
  Filled 2015-01-15: qty 2

## 2015-01-15 MED ORDER — SODIUM CHLORIDE 0.9 % IV BOLUS (SEPSIS)
1000.0000 mL | Freq: Once | INTRAVENOUS | Status: AC
  Administered 2015-01-15: 1000 mL via INTRAVENOUS

## 2015-01-15 MED ORDER — ONDANSETRON HCL 4 MG PO TABS: 4.0000 mg | ORAL_TABLET | Freq: Four times a day (QID) | ORAL | Status: DC | PRN

## 2015-01-15 MED ORDER — HYDROMORPHONE HCL 1 MG/ML IJ SOLN
1.0000 mg | INTRAMUSCULAR | Status: DC | PRN
  Administered 2015-01-16 (×6): 1 mg via INTRAVENOUS
  Filled 2015-01-15 (×5): qty 1

## 2015-01-15 MED ORDER — HYDROMORPHONE HCL 1 MG/ML IJ SOLN
1.5000 mg | INTRAMUSCULAR | Status: DC | PRN
  Administered 2015-01-16 (×3): 1.5 mg via INTRAVENOUS
  Filled 2015-01-15 (×4): qty 2

## 2015-01-15 MED ORDER — SODIUM CHLORIDE 0.9 % IV SOLN
INTRAVENOUS | Status: DC | PRN
  Administered 2015-01-15 (×2): 1000 mL via INTRAVENOUS

## 2015-01-15 NOTE — ED Notes (Signed)
Attempted report 

## 2015-01-15 NOTE — Progress Notes (Addendum)
Chaplain Note:   Chaplain paged that pt desired prayer.   Pt shared conversation with Chaplain.   No further support needed per pt.   Gala Romney, Chaplain 01/15/2015

## 2015-01-15 NOTE — ED Notes (Signed)
Dr. Rayburn Ma at bedside, stapled four lacerations to pt's back and head. Unable to find posterior head lac.

## 2015-01-15 NOTE — ED Notes (Signed)
See Trauma Note
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
## 2015-01-15 NOTE — ED Notes (Signed)
Placed patient belongings in an evidence bag.

## 2015-01-15 NOTE — ED Notes (Signed)
Istst lactic Acid results reported to Dr.Rees. ED-Lab
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
## 2015-01-16 ENCOUNTER — Inpatient Hospital Stay (HOSPITAL_COMMUNITY): Payer: Self-pay

## 2015-01-16 ENCOUNTER — Inpatient Hospital Stay (HOSPITAL_COMMUNITY): Payer: MEDICAID

## 2015-01-16 LAB — CBC
HCT: 45.3 % (ref 39.0–52.0)
HEMOGLOBIN: 15.5 g/dL (ref 13.0–17.0)
MCH: 28.7 pg (ref 26.0–34.0)
MCHC: 34.2 g/dL (ref 30.0–36.0)
MCV: 83.9 fL (ref 78.0–100.0)
Platelets: 214 10*3/uL (ref 150–400)
RBC: 5.4 MIL/uL (ref 4.22–5.81)
RDW: 17.3 % — ABNORMAL HIGH (ref 11.5–15.5)
WBC: 17 10*3/uL — ABNORMAL HIGH (ref 4.0–10.5)

## 2015-01-16 LAB — BASIC METABOLIC PANEL
ANION GAP: 12 (ref 5–15)
CHLORIDE: 106 mmol/L (ref 101–111)
CO2: 23 mmol/L (ref 22–32)
Calcium: 7.9 mg/dL — ABNORMAL LOW (ref 8.9–10.3)
Creatinine, Ser: 1.11 mg/dL (ref 0.61–1.24)
Glucose, Bld: 135 mg/dL — ABNORMAL HIGH (ref 65–99)
POTASSIUM: 4.5 mmol/L (ref 3.5–5.1)
SODIUM: 141 mmol/L (ref 135–145)

## 2015-01-16 LAB — MRSA PCR SCREENING: MRSA by PCR: POSITIVE — AB

## 2015-01-16 MED ORDER — ENOXAPARIN SODIUM 30 MG/0.3ML ~~LOC~~ SOLN
30.0000 mg | Freq: Two times a day (BID) | SUBCUTANEOUS | Status: DC
  Administered 2015-01-16 – 2015-01-17 (×4): 30 mg via SUBCUTANEOUS
  Filled 2015-01-16 (×4): qty 0.3

## 2015-01-16 MED ORDER — KETOROLAC TROMETHAMINE 15 MG/ML IJ SOLN
15.0000 mg | Freq: Three times a day (TID) | INTRAMUSCULAR | Status: DC | PRN
  Administered 2015-01-17 – 2015-01-18 (×3): 15 mg via INTRAVENOUS
  Filled 2015-01-16 (×6): qty 1

## 2015-01-16 MED ORDER — CHLORHEXIDINE GLUCONATE CLOTH 2 % EX PADS
6.0000 | MEDICATED_PAD | Freq: Every day | CUTANEOUS | Status: AC
  Administered 2015-01-17 – 2015-01-21 (×5): 6 via TOPICAL

## 2015-01-16 MED ORDER — DIPHENHYDRAMINE HCL 25 MG PO CAPS
25.0000 mg | ORAL_CAPSULE | Freq: Three times a day (TID) | ORAL | Status: DC | PRN
  Filled 2015-01-16: qty 1

## 2015-01-16 MED ORDER — KCL IN DEXTROSE-NACL 20-5-0.45 MEQ/L-%-% IV SOLN
INTRAVENOUS | Status: DC
  Administered 2015-01-16: 50 mL/h via INTRAVENOUS
  Administered 2015-01-17: 1000 mL via INTRAVENOUS
  Administered 2015-01-19 – 2015-01-21 (×6): via INTRAVENOUS
  Filled 2015-01-16 (×16): qty 1000

## 2015-01-16 MED ORDER — MUPIROCIN 2 % EX OINT
1.0000 "application " | TOPICAL_OINTMENT | Freq: Two times a day (BID) | CUTANEOUS | Status: AC
  Administered 2015-01-16 – 2015-01-21 (×10): 1 via NASAL
  Filled 2015-01-16 (×2): qty 22

## 2015-01-16 NOTE — Plan of Care (Signed)
Problem: Consults Goal: Skin Care Protocol Initiated - if Braden Score 18 or less If consults are not indicated, leave blank or document N/A Outcome: Progressing Pt is able to tolerate turns in bed although he finds it very painful to turn.   Problem: Phase I Progression Outcomes Goal: Pain controlled with appropriate interventions Outcome: Not Progressing Pt still reporting high levels of pain with administration of pain medications.  Goal: Voiding-avoid urinary catheter unless indicated Outcome: Progressing Pt has been able to avoid urinary catheter up to this point.
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
## 2015-01-17 ENCOUNTER — Inpatient Hospital Stay (HOSPITAL_COMMUNITY): Payer: Self-pay

## 2015-01-17 LAB — BASIC METABOLIC PANEL
Anion gap: 6 (ref 5–15)
CHLORIDE: 104 mmol/L (ref 101–111)
CO2: 24 mmol/L (ref 22–32)
Calcium: 7.8 mg/dL — ABNORMAL LOW (ref 8.9–10.3)
Creatinine, Ser: 0.81 mg/dL (ref 0.61–1.24)
GFR calc Af Amer: >60 mL/min (ref >60)
GFR calc non Af Amer: >60 mL/min (ref >60)
GLUCOSE: 112 mg/dL — AB (ref 65–99)
Potassium: 3.8 mmol/L (ref 3.5–5.1)
SODIUM: 134 mmol/L — AB (ref 135–145)

## 2015-01-17 LAB — BLOOD PRODUCT ORDER (VERBAL) VERIFICATION

## 2015-01-17 MED ORDER — OXYCODONE HCL 5 MG PO TABS
15.0000 mg | ORAL_TABLET | ORAL | Status: DC | PRN
  Administered 2015-01-17 – 2015-01-18 (×4): 15 mg via ORAL
  Filled 2015-01-17 (×4): qty 3

## 2015-01-17 MED ORDER — FENTANYL CITRATE (PF) 100 MCG/2ML IJ SOLN
25.0000 ug | INTRAMUSCULAR | Status: DC | PRN
  Administered 2015-01-17 – 2015-01-18 (×5): 50 ug via INTRAVENOUS
  Filled 2015-01-17 (×6): qty 2

## 2015-01-17 MED ORDER — OXYCODONE HCL 5 MG PO TABS
5.0000 mg | ORAL_TABLET | ORAL | Status: DC | PRN
  Filled 2015-01-17: qty 1

## 2015-01-17 MED ORDER — NICOTINE 14 MG/24HR TD PT24
14.0000 mg | MEDICATED_PATCH | TRANSDERMAL | Status: DC
Start: 2015-01-17 — End: 2015-01-23
  Administered 2015-01-17 – 2015-01-22 (×6): 14 mg via TRANSDERMAL
  Filled 2015-01-17 (×7): qty 1

## 2015-01-17 MED ORDER — OXYCODONE HCL 5 MG PO TABS: 10.0000 mg | ORAL_TABLET | ORAL | Status: DC | PRN
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
## 2015-01-17 NOTE — Progress Notes (Signed)
Chaplain initiated visit with pt. Chaplain informed pt of her services. Pt and chaplain explored difficulty of being in ICU when he is used to being independent and active. Chaplain offered emotional support. Page chaplain as needed.    01/17/15 1100  Clinical Encounter Type  Visited With Patient  Visit Type Initial;Spiritual support  Spiritual Encounters  Spiritual Needs Emotional  Stress Factors  Patient Stress Factors Health changes  Sapphire Tygart, Mayer Masker, Chaplain 01/17/2015 11:05 AM
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
## 2015-01-18 ENCOUNTER — Inpatient Hospital Stay (HOSPITAL_COMMUNITY): Payer: MEDICAID

## 2015-01-18 ENCOUNTER — Encounter (HOSPITAL_COMMUNITY): Payer: Self-pay | Admitting: Radiology

## 2015-01-18 ENCOUNTER — Inpatient Hospital Stay (HOSPITAL_COMMUNITY): Payer: Self-pay

## 2015-01-18 LAB — CBC
HCT: 30.5 % — ABNORMAL LOW (ref 39.0–52.0)
HEMATOCRIT: 19 % — AB (ref 39.0–52.0)
HEMOGLOBIN: 10.4 g/dL — AB (ref 13.0–17.0)
Hemoglobin: 6.7 g/dL — CL (ref 13.0–17.0)
MCH: 28.9 pg (ref 26.0–34.0)
MCH: 28.8 pg (ref 26.0–34.0)
MCHC: 34.1 g/dL (ref 30.0–36.0)
MCHC: 34.2 g/dL (ref 30.0–36.0)
MCV: 84.7 fL (ref 78.0–100.0)
MCV: 84.1 fL (ref 78.0–100.0)
PLATELETS: 166 10*3/uL (ref 150–400)
Platelets: 255 10*3/uL (ref 150–400)
RBC: 3.6 MIL/uL — ABNORMAL LOW (ref 4.22–5.81)
RBC: 2.26 MIL/uL — ABNORMAL LOW (ref 4.22–5.81)
RDW: 15.9 % — AB (ref 11.5–15.5)
RDW: 16.2 % — AB (ref 11.5–15.5)
WBC: 17.2 10*3/uL — ABNORMAL HIGH (ref 4.0–10.5)
WBC: 9.3 10*3/uL (ref 4.0–10.5)

## 2015-01-18 LAB — PREPARE RBC (CROSSMATCH)

## 2015-01-18 MED ORDER — OXYCODONE HCL 5 MG PO TABS
10.0000 mg | ORAL_TABLET | ORAL | Status: DC | PRN
  Administered 2015-01-19 – 2015-01-21 (×2): 10 mg via ORAL
  Filled 2015-01-18 (×2): qty 2

## 2015-01-18 MED ORDER — SODIUM CHLORIDE 0.9 % IV SOLN: Freq: Once | INTRAVENOUS | Status: DC

## 2015-01-18 MED ORDER — SODIUM CHLORIDE 0.9 % IV SOLN
Freq: Once | INTRAVENOUS | Status: AC
  Administered 2015-01-18: 04:00:00 via INTRAVENOUS

## 2015-01-18 MED ORDER — IOHEXOL 300 MG/ML  SOLN
100.0000 mL | Freq: Once | INTRAMUSCULAR | Status: AC | PRN
  Administered 2015-01-18: 100 mL via INTRAVENOUS

## 2015-01-18 MED ORDER — DEXTROSE 5 % IV SOLN
1000.0000 mg | Freq: Three times a day (TID) | INTRAVENOUS | Status: DC | PRN
  Administered 2015-01-18 – 2015-01-20 (×4): 1000 mg via INTRAVENOUS
  Filled 2015-01-18 (×9): qty 10

## 2015-01-18 MED ORDER — OXYCODONE HCL 5 MG PO TABS
20.0000 mg | ORAL_TABLET | ORAL | Status: DC | PRN
  Administered 2015-01-18 – 2015-01-23 (×22): 20 mg via ORAL
  Filled 2015-01-18 (×24): qty 4

## 2015-01-18 MED ORDER — OXYCODONE HCL 5 MG PO TABS
15.0000 mg | ORAL_TABLET | ORAL | Status: DC | PRN
  Administered 2015-01-22: 15 mg via ORAL
  Filled 2015-01-18: qty 3

## 2015-01-18 MED ORDER — SODIUM CHLORIDE 0.9 % IV BOLUS (SEPSIS)
500.0000 mL | Freq: Once | INTRAVENOUS | Status: AC
  Administered 2015-01-18: 500 mL via INTRAVENOUS

## 2015-01-18 MED ORDER — LORAZEPAM 2 MG/ML IJ SOLN
0.5000 mg | INTRAMUSCULAR | Status: AC
  Administered 2015-01-18: 0.5 mg via INTRAVENOUS
  Filled 2015-01-18: qty 1

## 2015-01-18 NOTE — Progress Notes (Signed)
Pt vomitted large amount of clear fluid. Pale, diaphoretic. 98.8,167, 32, 88/56. Dr. Derrell Lolling notified. Orders to transfer to ICU.
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
## 2015-01-18 NOTE — Progress Notes (Signed)
CRITICAL VALUE ALERT  Critical value received:  Hemoglobin 6.7  Date of notification:  01/18/2015  Time of notification:  19:08  Critical value read back:Yes.    Nurse who received alert:  Lennie Muckle RN   MD notified (1st page):  Dr. Dwain Sarna  Time of first page:  19:09  Responding MD:  Dr. Dwain Sarna  Time MD responded:  19:10  Dr. Dwain Sarna updated on pt's hemoglobin. Also made aware of max of 80 mL out of Right chest tube for this shift and that there has been moderate amount of drainage from the Right chest tube site with dressing change. MD aware that back hematoma has not appeared to be any larger but pt has had an increase in HR to the 140s and no decrease in BP. MD ordered 2 units of PRBCs to be given, a chest CT, and post-transfusion CBC & PT/INR. Orders passed to oncoming shift RN.
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
## 2015-01-18 NOTE — Progress Notes (Signed)
[facility] Patient's father called wanting information regarding his son.  Bethany, RN spoke with patients father who expressed to him that information can not be released over the phone being that the patient is a confidential patient.  The father was screaming saying we were not taking care of his son.  Bethany and I went in the room and asked if the patient had any concerns.  He said no and that he felt like he was cared for.  He said he will call his father to update him when his sister came to visit him.

## 2015-01-18 NOTE — Progress Notes (Signed)
Report given to [place], Charity fundraiser. Transferred pt to 3[facility].
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
## 2015-01-18 NOTE — Progress Notes (Signed)
R chest tube put back to suction -20 per MD order.
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
## 2015-01-18 NOTE — Progress Notes (Signed)
Pt reports pain medication not effective. HR sustaining in 170s. Dr. Derrell Lolling notified.

## 2015-01-19 ENCOUNTER — Inpatient Hospital Stay (HOSPITAL_COMMUNITY): Payer: Self-pay

## 2015-01-19 LAB — CBC
HCT: 26.1 % — ABNORMAL LOW (ref 39.0–52.0)
HCT: 26.1 % — ABNORMAL LOW (ref 39.0–52.0)
HEMOGLOBIN: 9.3 g/dL — AB (ref 13.0–17.0)
Hemoglobin: 9.2 g/dL — ABNORMAL LOW (ref 13.0–17.0)
MCH: 30.3 pg (ref 26.0–34.0)
MCH: 30.5 pg (ref 26.0–34.0)
MCHC: 35.6 g/dL (ref 30.0–36.0)
MCHC: 35.2 g/dL (ref 30.0–36.0)
MCV: 85 fL (ref 78.0–100.0)
MCV: 86.4 fL (ref 78.0–100.0)
Platelets: 167 10*3/uL (ref 150–400)
Platelets: 159 10*3/uL (ref 150–400)
RBC: 3.07 MIL/uL — AB (ref 4.22–5.81)
RBC: 3.02 MIL/uL — ABNORMAL LOW (ref 4.22–5.81)
RDW: 15.2 % (ref 11.5–15.5)
RDW: 15.3 % (ref 11.5–15.5)
WBC: 8.3 10*3/uL (ref 4.0–10.5)
WBC: 7.8 10*3/uL (ref 4.0–10.5)

## 2015-01-19 LAB — TYPE AND SCREEN
ABO/RH(D): A POS
Antibody Screen: NEGATIVE
UNIT DIVISION: 0
Unit division: 0
Unit division: 0
Unit division: 0

## 2015-01-19 LAB — PROTIME-INR
INR: 1.11 (ref 0.00–1.49)
Prothrombin Time: 14.5 seconds (ref 11.6–15.2)

## 2015-01-19 LAB — BASIC METABOLIC PANEL
ANION GAP: 5 (ref 5–15)
BUN: <5 mg/dL — ABNORMAL LOW (ref 6–20)
CO2: 28 mmol/L (ref 22–32)
Calcium: 7.8 mg/dL — ABNORMAL LOW (ref 8.9–10.3)
Chloride: 100 mmol/L — ABNORMAL LOW (ref 101–111)
Creatinine, Ser: 0.86 mg/dL (ref 0.61–1.24)
GLUCOSE: 125 mg/dL — AB (ref 65–99)
Potassium: 3.8 mmol/L (ref 3.5–5.1)
SODIUM: 133 mmol/L — AB (ref 135–145)

## 2015-01-19 MED ORDER — FENTANYL 25 MCG/HR TD PT72
25.0000 ug | MEDICATED_PATCH | TRANSDERMAL | Status: DC
  Administered 2015-01-19: 25 ug via TRANSDERMAL
  Filled 2015-01-19: qty 1
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
## 2015-01-20 ENCOUNTER — Inpatient Hospital Stay (HOSPITAL_COMMUNITY): Payer: Self-pay

## 2015-01-20 LAB — CBC
HCT: 25.6 % — ABNORMAL LOW (ref 39.0–52.0)
Hemoglobin: 9.1 g/dL — ABNORMAL LOW (ref 13.0–17.0)
MCH: 30.5 pg (ref 26.0–34.0)
MCHC: 35.5 g/dL (ref 30.0–36.0)
MCV: 85.9 fL (ref 78.0–100.0)
Platelets: 179 10*3/uL (ref 150–400)
RBC: 2.98 MIL/uL — AB (ref 4.22–5.81)
RDW: 15.2 % (ref 11.5–15.5)
WBC: 7.9 10*3/uL (ref 4.0–10.5)

## 2015-01-20 MED ORDER — ALPRAZOLAM 0.25 MG PO TABS
0.2500 mg | ORAL_TABLET | Freq: Three times a day (TID) | ORAL | Status: DC | PRN
Start: 2015-01-20 — End: 2015-01-23
  Administered 2015-01-20 – 2015-01-21 (×2): 0.25 mg via ORAL
  Filled 2015-01-20 (×3): qty 1

## 2015-01-20 MED ORDER — BACITRACIN-NEOMYCIN-POLYMYXIN 400-5-5000 EX OINT
TOPICAL_OINTMENT | CUTANEOUS | Status: AC
  Administered 2015-01-20: 1
  Filled 2015-01-20: qty 1

## 2015-01-20 NOTE — Care Management (Signed)
UR updated . Barrie Wale RN BSN  
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
## 2015-01-20 NOTE — Progress Notes (Signed)
Pt took fentanyl patch off earlier and threw in trash he said. I told pt not to throw away patch as it is supposed to stay on till removed by nurse.

## 2015-01-20 NOTE — Progress Notes (Signed)
As I looked in pts trash for the fentanyl patch he said he threw away he found it in his bed. I reapplied the fentanyl patch and told him that only a nurse should take it off when due for another dose.

## 2015-01-20 NOTE — Progress Notes (Signed)
Right chest tube removed per MD order. Pt tolerated well. Site intact, sutures tied and petroleum/gauze pressure dressing applied.

## 2015-01-21 ENCOUNTER — Inpatient Hospital Stay (HOSPITAL_COMMUNITY): Payer: Self-pay

## 2015-01-21 DIAGNOSIS — S272XXA Traumatic hemopneumothorax, initial encounter: Secondary | ICD-10-CM | POA: Diagnosis present

## 2015-01-21 DIAGNOSIS — D62 Acute posthemorrhagic anemia: Secondary | ICD-10-CM | POA: Diagnosis not present

## 2015-01-21 MED ORDER — MORPHINE SULFATE ER 30 MG PO TBCR
30.0000 mg | EXTENDED_RELEASE_TABLET | Freq: Two times a day (BID) | ORAL | Status: DC
  Administered 2015-01-21 – 2015-01-23 (×5): 30 mg via ORAL
  Filled 2015-01-21 (×5): qty 1

## 2015-01-21 MED ORDER — DOCUSATE SODIUM 100 MG PO CAPS
100.0000 mg | ORAL_CAPSULE | Freq: Two times a day (BID) | ORAL | Status: DC
  Administered 2015-01-21 – 2015-01-23 (×5): 100 mg via ORAL
  Filled 2015-01-21 (×5): qty 1

## 2015-01-21 MED ORDER — BACITRACIN-NEOMYCIN-POLYMYXIN 400-5-5000 EX OINT
TOPICAL_OINTMENT | CUTANEOUS | Status: AC
  Administered 2015-01-21: 11:00:00
  Filled 2015-01-21: qty 1

## 2015-01-21 MED ORDER — MORPHINE SULFATE ER 15 MG PO TBCR: 15.0000 mg | EXTENDED_RELEASE_TABLET | Freq: Two times a day (BID) | ORAL | Status: DC

## 2015-01-21 MED ORDER — POLYETHYLENE GLYCOL 3350 17 G PO PACK
17.0000 g | PACK | Freq: Every day | ORAL | Status: DC
  Administered 2015-01-21 – 2015-01-23 (×3): 17 g via ORAL
  Filled 2015-01-21 (×3): qty 1
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
## 2015-01-22 ENCOUNTER — Encounter (HOSPITAL_COMMUNITY): Payer: Self-pay | Admitting: Radiology

## 2015-01-22 ENCOUNTER — Inpatient Hospital Stay (HOSPITAL_COMMUNITY): Payer: Self-pay

## 2015-01-22 LAB — BASIC METABOLIC PANEL
Anion gap: 11 (ref 5–15)
CALCIUM: 8.4 mg/dL — AB (ref 8.9–10.3)
CO2: 24 mmol/L (ref 22–32)
CREATININE: 0.85 mg/dL (ref 0.61–1.24)
Chloride: 100 mmol/L — ABNORMAL LOW (ref 101–111)
Glucose, Bld: 86 mg/dL (ref 65–99)
Potassium: 3.8 mmol/L (ref 3.5–5.1)
Sodium: 135 mmol/L (ref 135–145)

## 2015-01-22 LAB — CBC
HCT: 25.1 % — ABNORMAL LOW (ref 39.0–52.0)
HEMOGLOBIN: 8.8 g/dL — AB (ref 13.0–17.0)
MCH: 29.5 pg (ref 26.0–34.0)
MCHC: 35.1 g/dL (ref 30.0–36.0)
MCV: 84.2 fL (ref 78.0–100.0)
PLATELETS: ADEQUATE 10*3/uL (ref 150–400)
RBC: 2.98 MIL/uL — AB (ref 4.22–5.81)
RDW: 15 % (ref 11.5–15.5)
WBC: 6.7 10*3/uL (ref 4.0–10.5)

## 2015-01-22 MED ORDER — IOHEXOL 300 MG/ML  SOLN
80.0000 mL | Freq: Once | INTRAMUSCULAR | Status: AC | PRN
Start: 2015-01-22 — End: 2015-01-22
  Administered 2015-01-22: 100 mL via INTRAVENOUS
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
## 2015-01-23 LAB — CBC WITH DIFFERENTIAL/PLATELET
BASOS PCT: 1 % (ref 0–1)
Basophils Absolute: 0 10*3/uL (ref 0.0–0.1)
EOS ABS: 0.5 10*3/uL (ref 0.0–0.7)
EOS PCT: 8 % — AB (ref 0–5)
HEMATOCRIT: 25.1 % — AB (ref 39.0–52.0)
Hemoglobin: 8.6 g/dL — ABNORMAL LOW (ref 13.0–17.0)
Lymphocytes Relative: 19 % (ref 12–46)
Lymphs Abs: 1.2 10*3/uL (ref 0.7–4.0)
MCH: 29.5 pg (ref 26.0–34.0)
MCHC: 34.3 g/dL (ref 30.0–36.0)
MCV: 86 fL (ref 78.0–100.0)
MONO ABS: 1 10*3/uL (ref 0.1–1.0)
MONOS PCT: 16 % — AB (ref 3–12)
NEUTROS ABS: 3.7 10*3/uL (ref 1.7–7.7)
Neutrophils Relative %: 56 % (ref 43–77)
Platelets: 323 10*3/uL (ref 150–400)
RBC: 2.92 MIL/uL — ABNORMAL LOW (ref 4.22–5.81)
RDW: 14.4 % (ref 11.5–15.5)
WBC: 6.5 10*3/uL (ref 4.0–10.5)

## 2015-01-23 MED ORDER — OXYCODONE HCL 10 MG PO TABS: 10.0000 mg | ORAL_TABLET | ORAL | Status: DC | PRN

## 2015-01-23 NOTE — Progress Notes (Signed)
IV removed per order. Staples removed and all areas steri stripped.Discharge instructions and prescription given and explained to patient with teach back. Discharged ambulatory to friend's care with NT present.

## 2015-01-23 NOTE — Discharge Instructions (Signed)
See above
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
## 2015-01-24 ENCOUNTER — Encounter (HOSPITAL_COMMUNITY): Payer: Self-pay | Admitting: Emergency Medicine

## 2015-02-07 ENCOUNTER — Encounter (HOSPITAL_COMMUNITY): Payer: Self-pay

## 2015-02-07 ENCOUNTER — Emergency Department (HOSPITAL_COMMUNITY)
Admission: EM | Admit: 2015-02-07 | Discharge: 2015-02-08 | Disposition: A | Discharge status: Other Acute Inpatient Hospital | Payer: Self-pay | Attending: Emergency Medicine | Admitting: Emergency Medicine

## 2015-02-07 DIAGNOSIS — F1210 Cannabis abuse, uncomplicated: Secondary | ICD-10-CM | POA: Insufficient documentation

## 2015-02-07 DIAGNOSIS — F1310 Sedative, hypnotic or anxiolytic abuse, uncomplicated: Secondary | ICD-10-CM | POA: Insufficient documentation

## 2015-02-07 DIAGNOSIS — F1410 Cocaine abuse, uncomplicated: Secondary | ICD-10-CM | POA: Insufficient documentation

## 2015-02-07 DIAGNOSIS — R4589 Other symptoms and signs involving emotional state: Secondary | ICD-10-CM

## 2015-02-07 DIAGNOSIS — F1110 Opioid abuse, uncomplicated: Secondary | ICD-10-CM | POA: Insufficient documentation

## 2015-02-07 DIAGNOSIS — Z720 Tobacco use: Secondary | ICD-10-CM | POA: Insufficient documentation

## 2015-02-07 DIAGNOSIS — R4689 Other symptoms and signs involving appearance and behavior: Secondary | ICD-10-CM

## 2015-02-07 DIAGNOSIS — F39 Unspecified mood [affective] disorder: Secondary | ICD-10-CM | POA: Diagnosis present

## 2015-02-07 LAB — ETHANOL: Alcohol, Ethyl (B): <5 mg/dL (ref <5)

## 2015-02-07 LAB — RAPID URINE DRUG SCREEN, HOSP PERFORMED
AMPHETAMINES: NOT DETECTED
BARBITURATES: NOT DETECTED
Benzodiazepines: POSITIVE — AB
Cocaine: POSITIVE — AB
Opiates: POSITIVE — AB
TETRAHYDROCANNABINOL: POSITIVE — AB

## 2015-02-07 LAB — COMPREHENSIVE METABOLIC PANEL
ALBUMIN: 3.6 g/dL (ref 3.5–5.0)
ALT: 77 U/L — ABNORMAL HIGH (ref 17–63)
AST: 48 U/L — AB (ref 15–41)
Alkaline Phosphatase: 90 U/L (ref 38–126)
Anion gap: 8 (ref 5–15)
BILIRUBIN TOTAL: 0.5 mg/dL (ref 0.3–1.2)
BUN: 9 mg/dL (ref 6–20)
CHLORIDE: 103 mmol/L (ref 101–111)
CO2: 27 mmol/L (ref 22–32)
Calcium: 8.9 mg/dL (ref 8.9–10.3)
Creatinine, Ser: 0.97 mg/dL (ref 0.61–1.24)
GFR calc Af Amer: >60 mL/min (ref >60)
GFR calc non Af Amer: >60 mL/min (ref >60)
GLUCOSE: 89 mg/dL (ref 65–99)
POTASSIUM: 2.9 mmol/L — AB (ref 3.5–5.1)
SODIUM: 138 mmol/L (ref 135–145)
TOTAL PROTEIN: 7.3 g/dL (ref 6.5–8.1)

## 2015-02-07 LAB — CBC
HEMATOCRIT: 38.4 % — AB (ref 39.0–52.0)
HEMOGLOBIN: 12.5 g/dL — AB (ref 13.0–17.0)
MCH: 28.7 pg (ref 26.0–34.0)
MCHC: 32.6 g/dL (ref 30.0–36.0)
MCV: 88.1 fL (ref 78.0–100.0)
Platelets: 314 10*3/uL (ref 150–400)
RBC: 4.36 MIL/uL (ref 4.22–5.81)
RDW: 14.6 % (ref 11.5–15.5)
WBC: 7.1 10*3/uL (ref 4.0–10.5)

## 2015-02-07 MED ORDER — NICOTINE 21 MG/24HR TD PT24
21.0000 mg | MEDICATED_PATCH | Freq: Once | TRANSDERMAL | Status: AC
  Administered 2015-02-07 – 2015-02-08 (×2): 21 mg via TRANSDERMAL
  Filled 2015-02-07 (×2): qty 1
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
## 2015-02-07 NOTE — ED Notes (Signed)
Bed: WA29 Expected date:  Expected time:  Means of arrival:  Comments: Rm tr 4

## 2015-02-07 NOTE — ED Notes (Signed)
TTS remains at bedside 
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
## 2015-02-07 NOTE — ED Notes (Signed)
One bag of belongings placed in locker 29.  

## 2015-02-07 NOTE — ED Notes (Signed)
TTS at bedside. 
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
## 2015-02-08 ENCOUNTER — Encounter (HOSPITAL_COMMUNITY): Payer: Self-pay

## 2015-02-08 ENCOUNTER — Inpatient Hospital Stay (HOSPITAL_COMMUNITY)
Admission: AD | Admit: 2015-02-08 | Discharge: 2015-02-11 | DRG: 897 | Disposition: A | Discharge status: Home / Self Care | Payer: Federal, State, Local not specified - Other | Source: Intra-hospital | Attending: Psychiatry | Admitting: Psychiatry

## 2015-02-08 DIAGNOSIS — F1920 Other psychoactive substance dependence, uncomplicated: Secondary | ICD-10-CM

## 2015-02-08 DIAGNOSIS — F39 Unspecified mood [affective] disorder: Secondary | ICD-10-CM | POA: Diagnosis present

## 2015-02-08 DIAGNOSIS — F17210 Nicotine dependence, cigarettes, uncomplicated: Secondary | ICD-10-CM | POA: Diagnosis present

## 2015-02-08 DIAGNOSIS — B1920 Unspecified viral hepatitis C without hepatic coma: Secondary | ICD-10-CM | POA: Diagnosis present

## 2015-02-08 DIAGNOSIS — F1120 Opioid dependence, uncomplicated: Principal | ICD-10-CM | POA: Diagnosis present

## 2015-02-08 DIAGNOSIS — F4010 Social phobia, unspecified: Secondary | ICD-10-CM | POA: Diagnosis present

## 2015-02-08 DIAGNOSIS — F418 Other specified anxiety disorders: Secondary | ICD-10-CM | POA: Diagnosis present

## 2015-02-08 DIAGNOSIS — J939 Pneumothorax, unspecified: Secondary | ICD-10-CM

## 2015-02-08 MED ORDER — LOPERAMIDE HCL 2 MG PO CAPS: 2.0000 mg | ORAL_CAPSULE | ORAL | Status: DC | PRN

## 2015-02-08 MED ORDER — CLONIDINE HCL 0.1 MG PO TABS
0.1000 mg | ORAL_TABLET | Freq: Four times a day (QID) | ORAL | Status: AC
  Administered 2015-02-09 – 2015-02-10 (×5): 0.1 mg via ORAL
  Filled 2015-02-08 (×9): qty 1

## 2015-02-08 MED ORDER — POTASSIUM CHLORIDE CRYS ER 20 MEQ PO TBCR
20.0000 meq | EXTENDED_RELEASE_TABLET | Freq: Every day | ORAL | Status: DC
  Administered 2015-02-08: 20 meq via ORAL
  Filled 2015-02-08: qty 1

## 2015-02-08 MED ORDER — TRAZODONE HCL 50 MG PO TABS: 50.0000 mg | ORAL_TABLET | Freq: Every day | ORAL | Status: DC

## 2015-02-08 MED ORDER — CLONIDINE HCL 0.1 MG PO TABS
0.1000 mg | ORAL_TABLET | Freq: Four times a day (QID) | ORAL | Status: DC
  Administered 2015-02-08 (×2): 0.1 mg via ORAL
  Filled 2015-02-08 (×2): qty 1

## 2015-02-08 MED ORDER — FLUOXETINE HCL 10 MG PO CAPS
10.0000 mg | ORAL_CAPSULE | Freq: Every day | ORAL | Status: DC
  Administered 2015-02-09 – 2015-02-11 (×3): 10 mg via ORAL
  Filled 2015-02-08 (×2): qty 3
  Filled 2015-02-08 (×4): qty 1

## 2015-02-08 MED ORDER — METHOCARBAMOL 500 MG PO TABS
500.0000 mg | ORAL_TABLET | Freq: Three times a day (TID) | ORAL | Status: DC | PRN
  Administered 2015-02-08 – 2015-02-11 (×5): 500 mg via ORAL
  Filled 2015-02-08 (×6): qty 1

## 2015-02-08 MED ORDER — ACETAMINOPHEN 325 MG PO TABS: 650.0000 mg | ORAL_TABLET | Freq: Four times a day (QID) | ORAL | Status: DC | PRN

## 2015-02-08 MED ORDER — DICYCLOMINE HCL 20 MG PO TABS
20.0000 mg | ORAL_TABLET | Freq: Four times a day (QID) | ORAL | Status: DC | PRN
  Filled 2015-02-08: qty 1

## 2015-02-08 MED ORDER — CLONIDINE HCL 0.1 MG PO TABS: 0.1000 mg | ORAL_TABLET | Freq: Every day | ORAL | Status: DC

## 2015-02-08 MED ORDER — CLONIDINE HCL 0.1 MG PO TABS
0.1000 mg | ORAL_TABLET | ORAL | Status: DC
  Administered 2015-02-11: 0.1 mg via ORAL
  Filled 2015-02-08 (×4): qty 1

## 2015-02-08 MED ORDER — ONDANSETRON 4 MG PO TBDP: 4.0000 mg | ORAL_TABLET | Freq: Four times a day (QID) | ORAL | Status: DC | PRN

## 2015-02-08 MED ORDER — HYDROXYZINE HCL 25 MG PO TABS
25.0000 mg | ORAL_TABLET | Freq: Four times a day (QID) | ORAL | Status: DC | PRN
  Administered 2015-02-10: 25 mg via ORAL
  Filled 2015-02-08 (×2): qty 1
  Filled 2015-02-08: qty 10

## 2015-02-08 MED ORDER — TRAZODONE HCL 50 MG PO TABS
50.0000 mg | ORAL_TABLET | Freq: Every day | ORAL | Status: DC
  Administered 2015-02-08 – 2015-02-10 (×3): 50 mg via ORAL
  Filled 2015-02-08: qty 1
  Filled 2015-02-08: qty 3
  Filled 2015-02-08 (×2): qty 1
  Filled 2015-02-08: qty 3
  Filled 2015-02-08 (×3): qty 1

## 2015-02-08 MED ORDER — FLUOXETINE HCL 10 MG PO CAPS
10.0000 mg | ORAL_CAPSULE | Freq: Every day | ORAL | Status: DC
  Administered 2015-02-08: 10 mg via ORAL
  Filled 2015-02-08: qty 1

## 2015-02-08 MED ORDER — DICYCLOMINE HCL 20 MG PO TABS: 20.0000 mg | ORAL_TABLET | Freq: Four times a day (QID) | ORAL | Status: DC | PRN

## 2015-02-08 MED ORDER — LORAZEPAM 1 MG PO TABS
1.0000 mg | ORAL_TABLET | Freq: Four times a day (QID) | ORAL | Status: DC | PRN
  Filled 2015-02-08: qty 1

## 2015-02-08 MED ORDER — HYDROXYZINE HCL 25 MG PO TABS: 25.0000 mg | ORAL_TABLET | Freq: Four times a day (QID) | ORAL | Status: DC | PRN

## 2015-02-08 MED ORDER — METHOCARBAMOL 500 MG PO TABS
500.0000 mg | ORAL_TABLET | Freq: Three times a day (TID) | ORAL | Status: DC | PRN
  Administered 2015-02-08: 500 mg via ORAL
  Filled 2015-02-08: qty 1

## 2015-02-08 MED ORDER — MAGNESIUM HYDROXIDE 400 MG/5ML PO SUSP
30.0000 mL | Freq: Every day | ORAL | Status: DC | PRN
Start: 2015-02-08 — End: 2015-02-11

## 2015-02-08 MED ORDER — ALUM & MAG HYDROXIDE-SIMETH 200-200-20 MG/5ML PO SUSP: 30.0000 mL | ORAL | Status: DC | PRN

## 2015-02-08 MED ORDER — LORAZEPAM 1 MG PO TABS: 1.0000 mg | ORAL_TABLET | Freq: Four times a day (QID) | ORAL | Status: DC | PRN

## 2015-02-08 MED ORDER — POTASSIUM CHLORIDE 20 MEQ PO PACK
20.0000 meq | PACK | Freq: Every day | ORAL | Status: DC
  Filled 2015-02-08: qty 1

## 2015-02-08 MED ORDER — ONDANSETRON 4 MG PO TBDP
4.0000 mg | ORAL_TABLET | Freq: Four times a day (QID) | ORAL | Status: DC | PRN
  Filled 2015-02-08: qty 1

## 2015-02-08 MED ORDER — CLONIDINE HCL 0.1 MG PO TABS: 0.1000 mg | ORAL_TABLET | ORAL | Status: DC

## 2015-02-08 MED ORDER — POTASSIUM CHLORIDE CRYS ER 20 MEQ PO TBCR
20.0000 meq | EXTENDED_RELEASE_TABLET | Freq: Every day | ORAL | Status: DC
  Administered 2015-02-09 – 2015-02-11 (×3): 20 meq via ORAL
  Filled 2015-02-08 (×6): qty 1

## 2015-02-08 NOTE — ED Notes (Signed)
Patient has been in his room most of the day.  Mother and sister visited earlier.  He has been pleasant and cooperative with staff.  Started on clonidine for opiate withdrawal.  Educated about other medications he could have for specific withdrawal symptoms.  He did request a Robaxin and this was given.  His appetite is good and he currently denies any diarrhea or abdominal cramping.
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
## 2015-02-08 NOTE — ED Notes (Signed)
Patient calm and cooperative at this time. Patient visiting with family. Patient denies SI, HI and AVH at this time. Plan of care discussed with patient. Patient voices no complaints or concerns at this time. Encouragement and support provided and safety maintain. Q 15 min safety checks remain in place.

## 2015-02-08 NOTE — ED Notes (Signed)
Patient denies SI, HI and AVH at this time. Patient oriented to unit. Plan of care discussed. Patient voices no complaints or concerns at this time. Encouragement support provided and safety check in place.

## 2015-02-08 NOTE — BH Assessment (Signed)
[facility] Assessment Progress Note  The following facilities have been contacted to seek placement for this pt, with results as noted:  Beds available, information sent, decision pending:  [place] [place] [facility] [place] [facility]   At capacity:  [facility]  [place] (no male beds)  [place], [place] Triage Specialist [phone]

## 2015-02-08 NOTE — ED Notes (Signed)
Attempted to call report; nurse unavailable at this time.

## 2015-02-08 NOTE — ED Notes (Signed)
Pt transported to [facility] by [employer] for continuation of specialized care. Pt left in no acute distress. Belongings signed for and given to [employer] officer. Pt left in no acute distress. 

## 2015-02-09 ENCOUNTER — Encounter (HOSPITAL_COMMUNITY): Payer: Self-pay | Admitting: Psychiatry

## 2015-02-09 DIAGNOSIS — F1920 Other psychoactive substance dependence, uncomplicated: Secondary | ICD-10-CM | POA: Diagnosis present

## 2015-02-09 DIAGNOSIS — F1120 Opioid dependence, uncomplicated: Principal | ICD-10-CM

## 2015-02-09 DIAGNOSIS — F4010 Social phobia, unspecified: Secondary | ICD-10-CM

## 2015-02-09 DIAGNOSIS — F39 Unspecified mood [affective] disorder: Secondary | ICD-10-CM

## 2015-02-09 MED ORDER — PNEUMOCOCCAL VAC POLYVALENT 25 MCG/0.5ML IJ INJ: 0.5000 mL | INJECTION | INTRAMUSCULAR | Status: DC

## 2015-02-09 MED ORDER — NICOTINE 21 MG/24HR TD PT24
21.0000 mg | MEDICATED_PATCH | Freq: Every day | TRANSDERMAL | Status: DC
  Administered 2015-02-09 – 2015-02-10 (×2): 21 mg via TRANSDERMAL
  Filled 2015-02-09: qty 14
  Filled 2015-02-09 (×3): qty 1
  Filled 2015-02-09: qty 14
  Filled 2015-02-09: qty 1

## 2015-02-09 NOTE — Progress Notes (Signed)
D: Patient presents in the bed majority of the morning. More active on the milieu as the shift progressed. He c/o withdrawal symptoms: body aches, chills. He denies AVH,. Denies SI/HI. Denies physical pain.   A: Special checks q 15 mins in place for safety. Medication administered per MD order (see eMAR), Encouragement and support provided.  R:safety maintained. Compliant with medication regimen. Will continue to monitor.
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
## 2015-02-09 NOTE — Progress Notes (Signed)
Recreation Therapy Notes  Date: 08.17.2016 Time: 9:30am Location: [address] Group room   Group Topic: Stress Management  Goal Area(s) Addresses:  Patient will actively participate in stress management techniques presented during session.   Behavioral Response: Did not attend.   Russie Gulledge L Jeanet Lupe, LRT/CTRS        Dahlia Nifong L 02/09/2015 3:04 PM 
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
## 2015-02-09 NOTE — Progress Notes (Signed)
Pt attended NA group this evening.  
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
## 2015-02-09 NOTE — BHH Group Notes (Signed)
[facility] LCSW Aftercare Discharge Planning Group Note   02/09/2015 11:17 AM  Participation Quality:  DID NOT ATTEND. Pt invited. Chose to remain in bed.   Smart, [employer]

## 2015-02-09 NOTE — Plan of Care (Signed)
Problem: Alteration in mood & ability to function due to Goal: LTG-Pt reports reduction in suicidal thoughts (Patient reports reduction in suicidal thoughts and is able to verbalize a safety plan for whenever patient is feeling suicidal)  Outcome: Progressing Pt denied SI tonight.  He verbally contracts for safety.   Goal: STG-Patient will attend groups Outcome: Progressing Pt attended evening group on 02/09/15.

## 2015-02-09 NOTE — Progress Notes (Signed)
D: Pt has anxious affect and mood.  He reports he had a "good" visit tonight.  Pt reports his goal today was to "stay positive, try not to think too much."  Pt denies SI/HI, denies hallucinations, denies withdrawal symptoms.  He reports chest pain "when I sneeze or cough" of 5/10.  Pt has been visible in milieu interacting with peers and staff appropriately.  Pt attended evening group.   A: Introduced self to pt.  Met with pt 1:1 and provided support and encouragement.  Actively listened to pt.  Offered PRN medication for pain, pt refused.  Medications administered per order.   R: Pt is compliant with medications.  Pt verbally contracts for safety and reports that he will notify staff of needs and concerns.  Will continue to monitor and assess.
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
## 2015-02-10 ENCOUNTER — Inpatient Hospital Stay (HOSPITAL_COMMUNITY): Payer: Federal, State, Local not specified - Other

## 2015-02-10 LAB — HEPATITIS PANEL, ACUTE
HEP A IGM: NEGATIVE
HEP B C IGM: NEGATIVE
HEP B S AG: NEGATIVE
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
## 2015-02-10 NOTE — Progress Notes (Signed)
[name] has done fair today. Because he has continued to complain of bilateral rib pain, he went to [facility] and had a CXR ( which was negative ) . His Hepatitis C titer returned positive today and he was notified by Dr. Dub Mikes. He has attended his groups this afternoon but demonstrates minimal  Engagement in his sobriety.   A  He became uncomfortable this afternoon and asked for " something for the pain and anxiety" and he was given prn robaxin and vistaril. Per Dr. Dub Mikes, he signed in voluntary. He has been seen lounging / laying on the group chairs in the dayroom and says over and over " this is rough man.Marland KitchenMarland KitchenI  feel awful man". He has spent a fair amount of time today talking on the patient  Phone on the 300 hall. He contracts verbally for safety but has yet to complete his daily assessment as requested.   R Safety is in place and poc cont.

## 2015-02-10 NOTE — [facility] Group Notes (Signed)
[facility] Group Notes:  (Nursing/MHT/Case Management/Adjunct)  Date:  02/10/2015  Time:  1:00 PM  Type of Therapy:  Nurse Education / Leisure Activity : The group is focused on teaching patients the importance of incorporating daily  leisure activities  And how positive these activities can impact their recovery.   Participation Level:  Did Not Attend  Participation Quality:  N/A  Affect:N/A    Cognitive:  N/A  Insight:  N/A  Engagement in Group:  N/A  Modes of Intervention:  N/A  Summary of Progress/Problems:  Rich Brave 02/10/2015, 1:00 PM

## 2015-02-10 NOTE — Progress Notes (Signed)
Patient attended Karaoke group tonight.  

## 2015-02-10 NOTE — Progress Notes (Signed)
Nutrition Education Note  RD led a group providing general, healthful nutrition education.  RD emphasized the importance of eating regular meals and snacks throughout the day. Consuming sugar-free beverages and incorporating fruits and vegetables into diet when possible. Provided examples of healthy snacks. Encouraged physical activity for at least 60 minutes a day. Patient encouraged to leave group with a goal to improve nutrition/healthy eating.   Expect good compliance.  Diet Order: Diet regular Room service appropriate?: Yes; Fluid consistency:: Thin Pt is also offered choice of unit snacks mid-morning and mid-afternoon.  Pt is eating as desired.   Labs and medications reviewed. If additional nutrition issues arise, please consult RD.  William Weide, MS, RD, LDN Pager: [phone] After Hours Pager: [phone]    
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
## 2015-02-10 NOTE — [facility] Suicide Risk Assessment (Signed)
[facility] INPATIENT:  Family/Significant Other Suicide Prevention Education  Suicide Prevention Education:  Contact Attempts: Amy Hammons (pt's mother) [phone] has been identified by the patient as the family member/significant other with whom the patient will be residing, and identified as the person(s) who will aid the patient in the event of a mental health crisis.  With written consent from the patient, two attempts were made to provide suicide prevention education, prior to and/or following the patient's discharge.  We were unsuccessful in providing suicide prevention education.  A suicide education pamphlet was given to the patient to share with family/significant other.  Date and time of first attempt: 02/10/15 at 10:50AM (CSW left message requesting call back at earliest convenience).   Smart, Akeema Broder LCSWA  02/10/2015, 10:53 AM   CSW called Amy again and reviewed SPE, discharge plan, and appts with her. She has no concerns regarding pt discharging tomorrow and is planning to pick him up after lunch.   Trula Slade, LCSWA Clinical Social Worker 02/10/2015 3:42 PM

## 2015-02-11 MED ORDER — NICOTINE 21 MG/24HR TD PT24: 21.0000 mg | MEDICATED_PATCH | Freq: Every day | TRANSDERMAL | Status: DC

## 2015-02-11 MED ORDER — FLUOXETINE HCL 10 MG PO CAPS: 10.0000 mg | ORAL_CAPSULE | Freq: Every day | ORAL | Status: DC

## 2015-02-11 MED ORDER — TRAZODONE HCL 50 MG PO TABS: 50.0000 mg | ORAL_TABLET | Freq: Every day | ORAL | Status: DC

## 2015-02-11 MED ORDER — HYDROXYZINE HCL 25 MG PO TABS: 25.0000 mg | ORAL_TABLET | Freq: Four times a day (QID) | ORAL | Status: DC | PRN
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
## 2015-02-11 NOTE — Progress Notes (Signed)
Adult Psychoeducational Group Note  Date:  02/11/2015 Time:  1:23 PM  Group Topic/Focus:      Relapse Prevention Planning:   The focus of this group is to define relapse and discuss the need for planning to combat relapse.  Participation Level:  Minimal  Participation Quality:  Appropriate  Affect:  Appropriate  Cognitive:  Appropriate  Insight: Appropriate  Engagement in Group:  Limited  Modes of Intervention:  Discussion  Additional Comments: Patient attended group this morning. Patient participated in group.  Patient shared that he would like to avoid some past friendships and surround himself with a more positive group of people.  Carlei Huang A 02/11/2015, 1:23 PM
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
## 2015-02-11 NOTE — Progress Notes (Signed)
Syrus is readied for discharge as MD completed DC order and DC SRA. Avrian completed his daily assessment and on it he wrote he denied  SI and he rated his depression, hopelessness and anxiety " 0/0/1", respectively. He was given sample meds as well as prescriptions and then his DC AVS was reviewed with him and he stated verbal understanding. All belongings in his locker were returned to him and he signed release per protocol and was escorted to entrance.

## 2015-02-11 NOTE — Progress Notes (Signed)
Patient ID: William [name], male   DOB: 02-04-1996, 19 y.o.   MRN: [id number]  D: Patient pleasant on approach tonight. Reports that his mood is ok and only minimal withdrawal symptoms. Reports continued rib pain and is getting Robaxin prn. Interacting well with peers. Denies any SI tonight. A: Staff will monitor on q 15 minute checks, follow treatment plan, and give meds as ordered. R: Cooperative on the unit

## 2015-02-11 NOTE — Progress Notes (Signed)
  [facility] :  Will you be returning to the same living situation after discharge:  Yes,  home with mother At discharge, do you have transportation home?: Yes,  mother coming after lunch Do you have the ability to pay for your medications: Yes,  mental health  Release of information consent forms completed and submitted to medical records by CSW. Patient to Follow up at: Follow-up Information    [facility] with [facility] On 02/15/2015.   Why:  Walk in any Tuesday between 9am-12pm for assessment (medication management, therapy, Substance Abuse Intensive Outpatient).    Contact information:   [address] [place], [place] [address] Phone: [phone] Fax: [phone]      Patient denies SI/HI: Yes,  during group/self report.    Safety Planning and Suicide Prevention discussed: Yes,  spe completed with pt's mother.   Have you used any form of tobacco in the last 30 days? (Cigarettes, Smokeless Tobacco, Cigars, and/or Pipes): Yes  Has patient been referred to the Quitline?: Patient refused referral  Smart, Lebron Quam  02/11/2015, 9:37 AM

## 2015-03-02 NOTE — Clinical Social Work Note (Signed)
Pt has care coordinator, Page Spiro, w [place] [phone]).  [place], LCSW Clinical Social Worker

## 2015-06-27 ENCOUNTER — Encounter (HOSPITAL_COMMUNITY): Payer: Self-pay | Admitting: Emergency Medicine

## 2015-06-27 ENCOUNTER — Emergency Department (HOSPITAL_COMMUNITY)
Admission: EM | Admit: 2015-06-27 | Discharge: 2015-06-27 | Disposition: A | Discharge status: Home / Self Care | Payer: Medicaid Other | Attending: Emergency Medicine | Admitting: Emergency Medicine

## 2015-06-27 DIAGNOSIS — Z79899 Other long term (current) drug therapy: Secondary | ICD-10-CM | POA: Insufficient documentation

## 2015-06-27 DIAGNOSIS — F17210 Nicotine dependence, cigarettes, uncomplicated: Secondary | ICD-10-CM | POA: Insufficient documentation

## 2015-06-27 DIAGNOSIS — K0889 Other specified disorders of teeth and supporting structures: Secondary | ICD-10-CM | POA: Insufficient documentation

## 2015-06-27 MED ORDER — IBUPROFEN 800 MG PO TABS: 800.0000 mg | ORAL_TABLET | Freq: Three times a day (TID) | ORAL | Status: DC

## 2015-06-27 MED ORDER — PENICILLIN V POTASSIUM 500 MG PO TABS: 500.0000 mg | ORAL_TABLET | Freq: Four times a day (QID) | ORAL | Status: AC

## 2015-06-27 MED ORDER — BUPIVACAINE-EPINEPHRINE (PF) 0.5% -1:200000 IJ SOLN
1.8000 mL | Freq: Once | INTRAMUSCULAR | Status: AC
  Administered 2015-06-27: 1.8 mL

## 2015-06-27 NOTE — ED Notes (Signed)
Pt from home for eval of dental pain to right upper mouth, pt states unable to eat or drink due to pain. Denies any n/v/d or fevers at home. Broken tooth noted upon examination. Airway intact.

## 2015-06-27 NOTE — Discharge Instructions (Signed)
Please follow-up with your dentist or use the resource guide to find a dentist for definitive care. Please take your antibiotics as prescribed. You may take your ibuprofen to help with discomfort. Return to ED for any new or worsening symptoms as we discussed.  [facility]  Community Service [facility]  [address]  [place], [place] [address]  Phone [phone]  [facility] [employer] [facility] in [place], [place], exemplifies the [employer] vision to improve the health and quality of life of all [place]ians by Public house manager with a passion to care for the underserved and by leading the nation in community-based, service learning oral health education. We are committed to offering comprehensive general dental services for adults, children and special needs patients in a safe, caring and professional setting.  Appointments: Our clinic is open Monday through Friday 8:00 a.m. until 5:00 p.m. The amount of time scheduled for an appointment depends on the patients specific needs. We ask that you keep your appointed time for care or provide 24-hour notice of all appointment changes. Parents or legal guardians must accompany minor children.  Payment for Services: Medicaid and other insurance plans are welcome. Payment for services is due when services are rendered and may be made by cash or credit card. If you have dental insurance, we will assist you with your claim submission.   Emergencies: Emergency services will be provided Monday through Friday on a walk-in basis. Please arrive early for emergency services. After hours emergency services will be provided for patients of record as required.  Services:  Comprehensive General Dentistry  Childrens Dentistry  Oral Surgery - Extractions  Root Canals  Sealants and Tooth Colored Fillings  Crowns and Bridges   Dentures and Partial Dentures  Implant Services  Periodontal Services and Agricultural engineer  3-D/[facility]   Emergency Department Resource Guide 1) Find a Doctor and Pay Out of Pocket Although you won't have to find out who is covered by your insurance plan, it is a good idea to ask around and get recommendations. You will then need to call the office and see if the doctor you have chosen will accept you as a new patient and what types of options they offer for patients who are self-pay. Some doctors offer discounts or will set up payment plans for their patients who do not have insurance, but you will need to ask so you aren't surprised when you get to your appointment.  2) Contact Your Local Health Department Not all health departments have doctors that can see patients for sick visits, but many do, so it is worth a call to see if yours does. If you don't know where your local health department is, you can check in your phone book. The CDC also has a tool to help you locate your state's health department, and many state websites also have listings of all of their local health departments.  3) Find a Walk-in Clinic If your illness is not likely to be very severe or complicated, you may want to try a walk in clinic. These are popping up all over the country in pharmacies, drugstores, and shopping centers. They're usually staffed by nurse practitioners or physician assistants that have been trained to treat common illnesses and complaints. They're usually fairly quick and inexpensive. However, if you have serious medical issues or chronic medical problems, these are  probably not your best option.  No Primary Care Doctor: - Call Health Connect at  [phone] - they can help you locate a primary care doctor that  accepts your insurance, provides certain services, etc. - Physician Referral Service- [phone]  Chronic Pain  Problems: Organization         Address  Phone   Notes  [facility]  [phone] Patients need to be referred by their primary care doctor.   Medication Assistance: Organization         Address  Phone   Notes  [facility] Medication [facility] [address] [place] [place], [place] [address] [phone] --Must be a resident of [facility] -- Must have NO insurance coverage whatsoever (no Medicaid/ Medicare, etc.) -- The pt. MUST have a primary care doctor that directs their care regularly and follows them in the community   [facility]ist  [phone]   [employer]  [phone]    Agencies that provide inexpensive medical care: Organization         Address  Phone   Notes  [facility]  [phone]   [facility]    [phone]   [facility] [address] [place], [place] [address] [phone]   [facility] [address] [place], [place] [phone]   Planned Parenthood    [phone]   [facility]    [phone]   [facility]  [address], [place] Phone:  [phone], Fax:  [phone] Hours of Operation:  9 am - 6 pm, M-F.  Also accepts Medicaid/Medicare and self-pay.  [facility]  [address], [place] Phone: [phone], Fax: [phone]. Hours of Operation:  8:30 am - 5:30 pm, M-F.  Also accepts Medicaid and self-pay.  [facility] [place] [address], [place] Phone: [phone]   [facility] [address] Natasha Bence [place], [place] [phone], Ext. [phone] Mondays & Thursdays: 7-9 AM.  First 15 patients are seen on a first come, first serve basis.    Medicaid-accepting [facility] Providers:  Organization         Address  Phone   Notes  [facility] [address], [place] [phone] Also  accepts self-pay patients.  [facility] [address] Laurell Josephs [place], [place]  [phone]   [facility] [address], [place] [phone]   [facility] [address] [place] [phone]   Renaye Rakers [address], [place]   [phone] Only accepts [place] Access [place] patients after they have their name applied to their card.   Self-Pay (no insurance) in [facility]:  Organization         Address  Phone   Notes  Sickle Cell Patients, [facility] [address] [place], [place] [phone]   [facility] [address] [place], [place] [phone]   [facility] [place]  [address], [place] [phone]   [facility]/Dr. Osei-Bonsu  [address] [place] or [address], [place] [phone] Phone number for both [place] and [place] locations is the same.  [facility]  [address], [place] [phone]   [facility] [address] [place] [address], [place] or [address] [phone]   [facility] [address], [place] [phone], phone; [phone]) [phone], fax Sees patients 1st and 3rd Saturday of every month.  Must not qualify for public or private insurance (i.e. Medicaid, Medicare, [place] Health Choice, Veterans' Benefits)  Household income should be no more than 200% of the poverty level The clinic cannot treat you if you are pregnant or think you are pregnant  Sexually transmitted diseases are not treated at the clinic.    Dental Care: Organization         Address  Phone  Notes  [facility] [address], [place] [phone] Accepts children up to age 22 who are enrolled in [place] or [place] Health Choice; pregnant  women with a Medicaid card; and children who have applied for Medicaid or [place] Health Choice, but were declined, whose parents can pay a reduced fee at time of service.  [facility] [place]  [address], [place] [phone] Accepts children up to age 22 who are enrolled in [place] or [place] Health Choice; pregnant women with a Medicaid card; and children who have applied for Medicaid or [place] Health Choice, but were declined, whose parents can pay a reduced fee at time of service.  [facility]  [address], [place] [phone] Patients are seen by appointment only. Walk-ins are not accepted. [facility] will see patients 22 years of age and older. Monday - Tuesday (8am-5pm) Most Wednesdays (8:30-5pm) $30 per visit, cash only  [facility]  [address], [place] [phone] Patients are seen by appointment only. Walk-ins are not accepted. [facility] will see patients 22 years of age and older. One Wednesday Evening (Monthly: Volunteer Based).  $30 per visit, cash only  [employer] [facility]  [phone] for adults; Children under age 464, call Graduate Pediatric Dentistry at [phone]) [phone]. Children aged 434-14, please call [phone] to request a pediatric application.  Dental services are provided in all areas of dental care including fillings, crowns and bridges, complete and partial dentures, implants, gum treatment, root canals, and extractions. Preventive care is also provided. Treatment is provided to both adults and children. Patients are selected via a lottery and there is often a waiting list.   [facility] [address], [place]  [phone] [email or web address]   [facility] [address], [place], [place] [phone], Ext. [phone] Second and Fourth Thursday of each month, opens at 6:30 AM; Clinic ends at 9 AM.  Patients are  seen on a first-come first-served basis, and a limited number are seen during each clinic.   [facility]  [address], [place], [place] [phone]   Eligibility Requirements You must have lived in [place], [place], or [place] counties for at least the last three months.   You cannot be eligible for state or federal sponsored National Cityhealthcare insurance, including CIGNAVeterans Administration, [place]icaid, or [employer]icare.   You generally cannot be eligible for healthcare insurance through your employer.    How to apply: Eligibility screenings are held every Tuesday and Wednesday afternoon from 1:00 pm until 4:00 pm. You do not need an appointment for the interview!  [facility]  [address], [place], [place] [phone]   [facility] Health Department  [phone]   [facility] Health Department  [phone]   [facility] Health Department  [phone]    Behavioral Health Resources in the Community: Intensive Outpatient Programs Organization         Address  Phone  Notes  [facility] Services [address], [place], [place] [phone]   [facility] Outpatient [address], [place], [place] [phone]   [facility] [address] [place], [place]  [phone]   [facility] Mental Health [address]  [place], [place] [phone] or [phone]   Substance Abuse Resources Organization         Address  Phone  Notes  Al[facility] Drug Services  [phone]   [facility]  [phone]   [facility] [place] [facility]  [phone]   Floydene Flock  [phone]   Residential & Outpatient Substance Abuse Program  [phone]   Psychological Services Organization         Address  Phone  Notes  [facility] Behavioral Health  [phone]   [facility] Services  [phone]   [facility] Mental Health [address] [place] [phone] or [phone]    Mobile Crisis  Teams Organization         Address  Phone  Notes  [facility], Mobile Crisis Care Unit  [phone]   [facility]  [address] [place], [place] [phone]   Doristine Locks [address] [place] [phone]    Self-Help/Support Groups Organization         Address  Phone             Notes  [facility] [place] - variety of support groups  [phone]- [phone] Call for more information  Narcotics Anonymous (NA), [facility] [address], [employer] [place]  2 meetings at this location   Statistician         Address  Phone  Notes  [facility] [address],    [place]  [phone]   [facility]  [address] [place] [address], [place], [place] [phone]   [facility] [address] [place], [place] [phone] Admissions: 8am-3pm M-F  [facility] [address],    [place], [place] [phone]   [facility] [address] [place], [place], [place] [phone]   [facility] [address]  [place], [place] [phone]   [facility] - Intensive Outpatient [address] Laurell Josephs [address], [place], [place] [phone]   [facility] [address] [place]  [place], [place] [phone] or [phone]   [facility] ([facility]) [address] [place], [place] [phone] Accepts Medicaid  [facility] [address]  [place] [phone] Substance Abuse/Addiction Treatment   [facility] Organization         Address  Phone  Notes  [facility]  [phone]   Angie Fava, PhD [address] Ervin Knack [place], [place]   [phone] or [phone]   [facility] Behavioral   [address] [place] [address] [place], [place] [phone]   [facility] [address] [place], [place] [phone]  Insurance/Medicaid/sponsorship through [employer] and Families [address]  [place], [place] [phone] [place], [place] [phone]    Dr. Adele Schilder  [phone]   [facility] Dept. 1) [address] [place] 2) [place] 3)  [address], [place] [phone]  [phone]   [place] [phone] or [phone] (After Hours)

## 2015-06-27 NOTE — ED Provider Notes (Signed)
CSN: [id number]     Arrival date & time 06/27/15  1429 History   First MD Initiated Contact with Patient 06/27/15 1438     Chief Complaint  Patient presents with  . Dental Pain     (Consider location/radiation/quality/duration/timing/severity/associated sxs/prior Treatment) HPI William [name] is a 20 y.o. male who comes in for evaluation of dental pain. Patient reports onset of constant, moderate bilateral upper dental pain for the past 2 days. He has taken Tylenol without relief of his symptoms. He does not have a dentist. He denies any difficulties breathing, opening his jaw, sore throat, fevers or chills. Chewing worsens the problem, nothing seems to make it better. No other modifying factors.  History reviewed. No pertinent past medical history. History reviewed. No pertinent past surgical history. No family history on file. Social History  Substance Use Topics  . Smoking status: Current Every Day Smoker -- 1.00 packs/day for 2 years    Types: Cigarettes  . Smokeless tobacco: Never Used     Comment: 1 pack every 2 days  . Alcohol Use: Yes     Comment: 1 beer every 2 weeks    Review of Systems A 10 point review of systems was completed and was negative except for pertinent positives and negatives as mentioned in the history of present illness     Allergies  Hydrocodone; Hydrocodone; Ibuprofen; Motrin; Tramadol; and Tramadol  Home Medications   Prior to Admission medications   Medication Sig Start Date End Date Taking? Authorizing Provider  FLUoxetine (PROZAC) 10 MG capsule Take 1 capsule (10 mg total) by mouth daily. 02/11/15   Adonis BrookSheila Agustin, NP  hydrOXYzine (ATARAX/VISTARIL) 25 MG tablet Take 1 tablet (25 mg total) by mouth every 6 (six) hours as needed for anxiety. 02/11/15   Adonis BrookSheila Agustin, NP  ibuprofen (ADVIL,MOTRIN) 800 MG tablet Take 1 tablet (800 mg total) by mouth 3 (three) times daily. 06/27/15   Joycie PeekBenjamin Marqual Mi, PA-C  nicotine (NICODERM CQ - DOSED IN MG/24 HOURS) 21  mg/24hr patch Place 1 patch (21 mg total) onto the skin daily. 02/11/15   Adonis BrookSheila Agustin, NP  penicillin v potassium (VEETID) 500 MG tablet Take 1 tablet (500 mg total) by mouth 4 (four) times daily. 06/27/15 07/04/15  Joycie PeekBenjamin Rico Massar, PA-C  traZODone (DESYREL) 50 MG tablet Take 1 tablet (50 mg total) by mouth at bedtime. 02/11/15   Adonis BrookSheila Agustin, NP   BP 121/69 mmHg  Pulse 98  Temp(Src) 98.2 F (36.8 C) (Oral)  Resp 19  Ht 5\' 5"  (1.651 m)  Wt 63.504 kg  BMI 23.30 kg/m2  SpO2 100% Physical Exam  Constitutional:  Awake, alert, nontoxic appearance.  HENT:  Head: Atraumatic.  Discomfort located to bilateral maxillary premolars. Worse on right side with active caries. Mucous membranes are moist. No unilateral tonsillar swelling, uvula midline, no glossal swelling or elevation. No trismus. No fluctuance or evidence of a drainable abscess. No other evidence of emergent infection, Retropharyngeal or Peritonsillar abscess, Ludwig or Vincents angina. Tolerating secretions well. Patent airway   Eyes: Right eye exhibits no discharge. Left eye exhibits no discharge.  Neck: Neck supple.  Pulmonary/Chest: Effort normal. He exhibits no tenderness.  Abdominal: Soft. There is no tenderness. There is no rebound.  Musculoskeletal: He exhibits no tenderness.  Baseline ROM, no obvious new focal weakness.  Neurological:  Mental status and motor strength appears baseline for patient and situation.  Skin: No rash noted.  Psychiatric: He has a normal mood and affect.  Nursing note and vitals  reviewed.   ED Course  Procedures (including critical care time) Labs Review Labs Reviewed - No data to display  NERVE BLOCK Performed by: Sharlene Motts Consent: Verbal consent obtained. Required items: required blood products, implants, devices, and special equipment available Time out: Immediately prior to procedure a "time out" was called to verify the correct patient, procedure, equipment, support staff and  site/side marked as required.  Indication: Dental pain  Nerve block body site: Right superior premolars   Preparation: Patient was prepped and draped in the usual sterile fashion. Needle gauge: 24 G Location technique: anatomical landmarks  Local anesthetic: Bupivacaine   Anesthetic total: 1.8 ml  Outcome: pain improved Patient tolerance: Patient tolerated the procedure well with no immediate complications.  Imaging Review No results found. I have personally reviewed and evaluated these images and lab results as part of my medical decision-making.   EKG Interpretation None     Meds given in ED:  Medications  bupivacaine-epinephrine (MARCAINE W/ EPI) 0.5% -1:200000 injection 1.8 mL (1.8 mLs Infiltration Given 06/27/15 1535)    New Prescriptions   IBUPROFEN (ADVIL,MOTRIN) 800 MG TABLET    Take 1 tablet (800 mg total) by mouth 3 (three) times daily.   PENICILLIN V POTASSIUM (VEETID) 500 MG TABLET    Take 1 tablet (500 mg total) by mouth 4 (four) times daily.   Filed Vitals:   06/27/15 1444  BP: 121/69  Pulse: 98  Temp: 98.2 F (36.8 C)  TempSrc: Oral  Resp: 19  Height: 5\' 5"  (1.651 m)  Weight: 63.504 kg  SpO2: 100%    MDM  Here for evaluation of dental pain. On exam, there is no evidence of a drainable abscess. No trismus, glossal elevation, unilateral tonsillar swelling. No evidence of retropharyngeal or peritonsillar abscess or Ludwig angina. Patient received a dental block in the ED and experiences relief. Discharged with outpatient dental resources. Also given prescription for anti-inflammatories, patient states he can take ibuprofen, and antibiotics. Overall, appears well, nontoxic and appropriate for discharge.  The patient appears reasonably screened and/or stabilized for discharge and I doubt any other medical condition or other [facility] requiring further screening, evaluation, or treatment in the ED at this time prior to discharge.   Final diagnoses:  Pain, dental         Joycie Peek, PA-C 06/27/15 1548  Pricilla Loveless, MD 06/28/15 [phone]

## 2015-06-27 NOTE — ED Notes (Signed)
Patient able to ambulate independently  

## 2015-06-27 NOTE — ED Notes (Signed)
PA at bedside.

## 2015-11-20 ENCOUNTER — Encounter (HOSPITAL_COMMUNITY): Payer: Self-pay | Admitting: Emergency Medicine

## 2015-11-20 ENCOUNTER — Emergency Department (HOSPITAL_COMMUNITY)
Admission: EM | Admit: 2015-11-20 | Discharge: 2015-11-20 | Disposition: A | Discharge status: Home / Self Care | Payer: Medicaid Other | Attending: Emergency Medicine | Admitting: Emergency Medicine

## 2015-11-20 ENCOUNTER — Emergency Department (HOSPITAL_COMMUNITY): Payer: Medicaid Other

## 2015-11-20 DIAGNOSIS — F17210 Nicotine dependence, cigarettes, uncomplicated: Secondary | ICD-10-CM | POA: Insufficient documentation

## 2015-11-20 DIAGNOSIS — Y939 Activity, unspecified: Secondary | ICD-10-CM | POA: Insufficient documentation

## 2015-11-20 DIAGNOSIS — Y9289 Other specified places as the place of occurrence of the external cause: Secondary | ICD-10-CM | POA: Insufficient documentation

## 2015-11-20 DIAGNOSIS — S61411A Laceration without foreign body of right hand, initial encounter: Secondary | ICD-10-CM

## 2015-11-20 DIAGNOSIS — Y999 Unspecified external cause status: Secondary | ICD-10-CM | POA: Insufficient documentation

## 2015-11-20 DIAGNOSIS — Z23 Encounter for immunization: Secondary | ICD-10-CM | POA: Insufficient documentation

## 2015-11-20 MED ORDER — AMOXICILLIN-POT CLAVULANATE 875-125 MG PO TABS: 1.0000 | ORAL_TABLET | Freq: Two times a day (BID) | ORAL | Status: DC

## 2015-11-20 MED ORDER — AMOXICILLIN-POT CLAVULANATE 875-125 MG PO TABS
1.0000 | ORAL_TABLET | Freq: Once | ORAL | Status: AC
  Administered 2015-11-20: 1 via ORAL
  Filled 2015-11-20: qty 1

## 2015-11-20 MED ORDER — ACETAMINOPHEN 500 MG PO TABS
1000.0000 mg | ORAL_TABLET | Freq: Once | ORAL | Status: AC
  Administered 2015-11-20: 1000 mg via ORAL
  Filled 2015-11-20: qty 2

## 2015-11-20 MED ORDER — TETANUS-DIPHTH-ACELL PERTUSSIS 5-2.5-18.5 LF-MCG/0.5 IM SUSP
0.5000 mL | Freq: Once | INTRAMUSCULAR | Status: AC
  Administered 2015-11-20: 0.5 mL via INTRAMUSCULAR
  Filled 2015-11-20: qty 0.5

## 2015-11-20 NOTE — ED Notes (Signed)
PT DISCHARGED. INSTRUCTIONS AND PRESCRIPTIONS GIVEN. AAOX4. PT IN NO APPARENT DISTRESS. THE OPPORTUNITY TO ASK QUESTIONS WAS PROVIDED. 
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
## 2015-11-20 NOTE — Discharge Instructions (Signed)
Laceration Care, Adult  A laceration is a cut that goes through all layers of the skin. The cut also goes into the tissue that is right under the skin. Some cuts heal on their own. Others need to be closed with stitches (sutures), staples, skin adhesive strips, or wound glue. Taking care of your cut lowers your risk of infection and helps your cut to heal better.  HOW TO TAKE CARE OF YOUR CUT  For stitches or staples:  · Keep the wound clean and dry.  · If you were given a bandage (dressing), you should change it at least one time per day or as told by your doctor. You should also change it if it gets wet or dirty.  · Keep the wound completely dry for the first 24 hours or as told by your doctor. After that time, you may take a shower or a bath. However, make sure that the wound is not soaked in water until after the stitches or staples have been removed.  · Clean the wound one time each day or as told by your doctor:    Wash the wound with soap and water.    Rinse the wound with water until all of the soap comes off.    Pat the wound dry with a clean towel. Do not rub the wound.  · After you clean the wound, put a thin layer of antibiotic ointment on it as told by your doctor. This ointment:    Helps to prevent infection.    Keeps the bandage from sticking to the wound.  · Have your stitches or staples removed as told by your doctor.  If your doctor used skin adhesive strips:   · Keep the wound clean and dry.  · If you were given a bandage, you should change it at least one time per day or as told by your doctor. You should also change it if it gets dirty or wet.  · Do not get the skin adhesive strips wet. You can take a shower or a bath, but be careful to keep the wound dry.  · If the wound gets wet, pat it dry with a clean towel. Do not rub the wound.  · Skin adhesive strips fall off on their own. You can trim the strips as the wound heals. Do not remove any strips that are still stuck to the wound. They will  fall off after a while.  If your doctor used wound glue:  · Try to keep your wound dry, but you may briefly wet it in the shower or bath. Do not soak the wound in water, such as by swimming.  · After you take a shower or a bath, gently pat the wound dry with a clean towel. Do not rub the wound.  · Do not do any activities that will make you really sweaty until the skin glue has fallen off on its own.  · Do not apply liquid, cream, or ointment medicine to your wound while the skin glue is still on.  · If you were given a bandage, you should change it at least one time per day or as told by your doctor. You should also change it if it gets dirty or wet.  · If a bandage is placed over the wound, do not let the tape for the bandage touch the skin glue.  · Do not pick at the glue. The skin glue usually stays on for 5-10 days. Then, it   or when wound glue stays in place and the wound is healed. Make sure to wear a sunscreen of at least 30 SPF.  Take over-the-counter and prescription medicines only as told by your doctor.  If you were given antibiotic medicine or ointment, take or apply it as told by your doctor. Do not stop using the antibiotic even if your wound is getting better.  Do not scratch or pick at the wound.  Keep all follow-up visits as told by your doctor. This is important.  Check your wound every day for signs of infection. Watch for:  Redness, swelling, or pain.  Fluid, blood, or pus.  Raise (elevate) the injured area above the level of your heart while you are sitting or lying down, if possible. GET HELP IF:  You got a tetanus shot and you have any of these problems at the injection site:  Swelling.  Very bad pain.  Redness.  Bleeding.  You have a fever.  A wound that was  closed breaks open.  You notice a bad smell coming from your wound or your bandage.  You notice something coming out of the wound, such as wood or glass.  Medicine does not help your pain.  You have more redness, swelling, or pain at the site of your wound.  You have fluid, blood, or pus coming from your wound.  You notice a change in the color of your skin near your wound.  You need to change the bandage often because fluid, blood, or pus is coming from the wound.  You start to have a new rash.  You start to have numbness around the wound. GET HELP RIGHT AWAY IF:  You have very bad swelling around the wound.  Your pain suddenly gets worse and is very bad.  You notice painful lumps near the wound or on skin that is anywhere on your body.  You have a red streak going away from your wound.  The wound is on your hand or foot and you cannot move a finger or toe like you usually can.  The wound is on your hand or foot and you notice that your fingers or toes look pale or bluish.   This information is not intended to replace advice given to you by your health care provider. Make sure you discuss any questions you have with your health care provider.  Take antibiotics as prescribed. Keep wound clean and dry. You may wash with antibacterial soap and water. Take Tylenol at home as needed for pain. Return to the ED if you expands significant worsening of her symptoms, redness or swelling around her wound, fevers, chills.
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
## 2016-02-03 ENCOUNTER — Encounter (HOSPITAL_COMMUNITY): Payer: Self-pay | Admitting: Emergency Medicine

## 2016-02-03 ENCOUNTER — Emergency Department (HOSPITAL_COMMUNITY)
Admission: EM | Admit: 2016-02-03 | Discharge: 2016-02-03 | Disposition: A | Discharge status: Home / Self Care | Payer: Medicaid Other | Attending: Emergency Medicine | Admitting: Emergency Medicine

## 2016-02-03 ENCOUNTER — Emergency Department (HOSPITAL_COMMUNITY): Payer: Medicaid Other

## 2016-02-03 DIAGNOSIS — S2231XA Fracture of one rib, right side, initial encounter for closed fracture: Secondary | ICD-10-CM

## 2016-02-03 DIAGNOSIS — F17210 Nicotine dependence, cigarettes, uncomplicated: Secondary | ICD-10-CM | POA: Insufficient documentation

## 2016-02-03 DIAGNOSIS — Z79899 Other long term (current) drug therapy: Secondary | ICD-10-CM | POA: Insufficient documentation

## 2016-02-03 DIAGNOSIS — Y999 Unspecified external cause status: Secondary | ICD-10-CM | POA: Insufficient documentation

## 2016-02-03 DIAGNOSIS — W57XXXA Bitten or stung by nonvenomous insect and other nonvenomous arthropods, initial encounter: Secondary | ICD-10-CM | POA: Insufficient documentation

## 2016-02-03 DIAGNOSIS — R451 Restlessness and agitation: Secondary | ICD-10-CM | POA: Insufficient documentation

## 2016-02-03 DIAGNOSIS — Y929 Unspecified place or not applicable: Secondary | ICD-10-CM | POA: Insufficient documentation

## 2016-02-03 DIAGNOSIS — Y939 Activity, unspecified: Secondary | ICD-10-CM | POA: Insufficient documentation

## 2016-02-03 LAB — CBC
HEMATOCRIT: 45.7 % (ref 39.0–52.0)
HEMOGLOBIN: 16.4 g/dL (ref 13.0–17.0)
MCH: 31.1 pg (ref 26.0–34.0)
MCHC: 35.9 g/dL (ref 30.0–36.0)
MCV: 86.7 fL (ref 78.0–100.0)
Platelets: 180 10*3/uL (ref 150–400)
RBC: 5.27 MIL/uL (ref 4.22–5.81)
RDW: 12.7 % (ref 11.5–15.5)
WBC: 8.6 10*3/uL (ref 4.0–10.5)

## 2016-02-03 LAB — COMPREHENSIVE METABOLIC PANEL
ALBUMIN: 4.1 g/dL (ref 3.5–5.0)
ALT: 78 U/L — ABNORMAL HIGH (ref 17–63)
ANION GAP: 8 (ref 5–15)
AST: 59 U/L — ABNORMAL HIGH (ref 15–41)
Alkaline Phosphatase: 91 U/L (ref 38–126)
BUN: 6 mg/dL (ref 6–20)
CO2: 26 mmol/L (ref 22–32)
Calcium: 9.2 mg/dL (ref 8.9–10.3)
Chloride: 105 mmol/L (ref 101–111)
Creatinine, Ser: 0.8 mg/dL (ref 0.61–1.24)
GFR calc Af Amer: >60 mL/min (ref >60)
GFR calc non Af Amer: >60 mL/min (ref >60)
GLUCOSE: 100 mg/dL — AB (ref 65–99)
POTASSIUM: 3.3 mmol/L — AB (ref 3.5–5.1)
SODIUM: 139 mmol/L (ref 135–145)
Total Bilirubin: 0.6 mg/dL (ref 0.3–1.2)
Total Protein: 7.6 g/dL (ref 6.5–8.1)

## 2016-02-03 LAB — RAPID URINE DRUG SCREEN, HOSP PERFORMED
AMPHETAMINES: POSITIVE — AB
BARBITURATES: NOT DETECTED
Benzodiazepines: NOT DETECTED
COCAINE: POSITIVE — AB
OPIATES: NOT DETECTED
TETRAHYDROCANNABINOL: POSITIVE — AB

## 2016-02-03 LAB — ETHANOL: Alcohol, Ethyl (B): <5 mg/dL (ref <5)

## 2016-02-03 LAB — ACETAMINOPHEN LEVEL

## 2016-02-03 LAB — SALICYLATE LEVEL: Salicylate Lvl: <4 mg/dL (ref 2.8–30.0)

## 2016-02-03 MED ORDER — METHOCARBAMOL 750 MG PO TABS: 750.0000 mg | ORAL_TABLET | Freq: Four times a day (QID) | ORAL | 0 refills | Status: DC

## 2016-02-03 NOTE — ED Notes (Signed)
Patient requesting to security to speak with a nurse.  Patient standing outside triage room 4 in the hallway. Patient states that he is hurting and needs to be seen by a MD ASAP!  RN explained to patient the MDs are making their rounds through the department and one would be coming to see him shortly.  Patient states that he needs a different room because "this room is freaking me out!"  RN explained to patient that this was the only room available until after he is seen by a MD.  Patient states that he dont feel comfortable in that room,.  RN asked patient what about the room that makes him feel uncomfortable.  Patient unable to tell nurse what about the room makes him feel uncomfortable.  Patient asking to use the phone.  Showed patient to nurses' desk and dialed phone number for patient.   While trying to use the phone patient repeatedly states, "I am dying and yall aren't helping me".  Arlice ColtMorgan Rn explaining the process of medical clearance and asking patient if he will go back to room she will hook him up on heart monitor and re assess his vital signs.
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
## 2016-02-03 NOTE — ED Notes (Signed)
Pt request to use phone to call mother. With using phone pt states "my heart is going to explode. I am going to die." Pt continues to report "feels unsafe in my room." Pt reassured safety. EKG obtained and [facility] to [facility].

## 2016-02-03 NOTE — ED Notes (Addendum)
Freida BusmanAllen MD aware of pt status and complaint of generalized insect bites to arms, right ribcage pain, and paranoia. Freida BusmanAllen MD verbalizes someone will see pt shortly.

## 2016-02-03 NOTE — ED Notes (Addendum)
Pt seems paranoid. Pt reports "I woke up and seen two normal guys standing over me. It's been like that everywhere I go. I'm homeless and it's serious. I had sleep paralysis and couldn't move in my sleep."

## 2016-02-03 NOTE — ED Notes (Signed)
MD at bedside. 

## 2016-02-03 NOTE — ED Notes (Signed)
Patient transported to X-ray 

## 2016-02-03 NOTE — ED Triage Notes (Signed)
Pt complaint of generalized insect bites to arms, right ribcage pain, and "people following me this morning." Pt believes "people are after me and trying to hurt me." Pt denies SI/HI; denies A/VH.

## 2016-02-03 NOTE — ED Notes (Signed)
Pt requesting copies of his x-ray. Radiology made aware.

## 2016-08-26 ENCOUNTER — Emergency Department (HOSPITAL_COMMUNITY)
Admission: EM | Admit: 2016-08-26 | Discharge: 2016-08-27 | Disposition: A | Discharge status: Home / Self Care | Payer: Federal, State, Local not specified - Other | Attending: Emergency Medicine | Admitting: Emergency Medicine

## 2016-08-26 ENCOUNTER — Emergency Department (HOSPITAL_COMMUNITY): Payer: Self-pay

## 2016-08-26 ENCOUNTER — Encounter (HOSPITAL_COMMUNITY): Payer: Self-pay

## 2016-08-26 DIAGNOSIS — Z79899 Other long term (current) drug therapy: Secondary | ICD-10-CM | POA: Insufficient documentation

## 2016-08-26 DIAGNOSIS — F1414 Cocaine abuse with cocaine-induced mood disorder: Secondary | ICD-10-CM

## 2016-08-26 DIAGNOSIS — R440 Auditory hallucinations: Secondary | ICD-10-CM | POA: Insufficient documentation

## 2016-08-26 DIAGNOSIS — R441 Visual hallucinations: Secondary | ICD-10-CM | POA: Insufficient documentation

## 2016-08-26 DIAGNOSIS — R443 Hallucinations, unspecified: Secondary | ICD-10-CM

## 2016-08-26 DIAGNOSIS — F17210 Nicotine dependence, cigarettes, uncomplicated: Secondary | ICD-10-CM | POA: Insufficient documentation

## 2016-08-26 DIAGNOSIS — F1410 Cocaine abuse, uncomplicated: Secondary | ICD-10-CM

## 2016-08-26 HISTORY — DX: Schizophrenia, unspecified: F20.9

## 2016-08-26 LAB — URINALYSIS, ROUTINE W REFLEX MICROSCOPIC
GLUCOSE, UA: NEGATIVE mg/dL
HGB URINE DIPSTICK: NEGATIVE
Ketones, ur: 20 mg/dL — AB
LEUKOCYTES UA: NEGATIVE
NITRITE: NEGATIVE
PH: 8 (ref 5.0–8.0)
Protein, ur: 30 mg/dL — AB
RBC / HPF: NONE SEEN RBC/hpf (ref 0–5)
SPECIFIC GRAVITY, URINE: 1.026 (ref 1.005–1.030)
Squamous Epithelial / LPF: NONE SEEN
WBC, UA: NONE SEEN WBC/hpf (ref 0–5)

## 2016-08-26 LAB — CBC
HEMATOCRIT: 46.6 % (ref 39.0–52.0)
Hemoglobin: 16.7 g/dL (ref 13.0–17.0)
MCH: 30.1 pg (ref 26.0–34.0)
MCHC: 35.8 g/dL (ref 30.0–36.0)
MCV: 84.1 fL (ref 78.0–100.0)
Platelets: 189 10*3/uL (ref 150–400)
RBC: 5.54 MIL/uL (ref 4.22–5.81)
RDW: 12.2 % (ref 11.5–15.5)
WBC: 7.4 10*3/uL (ref 4.0–10.5)

## 2016-08-26 LAB — COMPREHENSIVE METABOLIC PANEL
ALBUMIN: 4 g/dL (ref 3.5–5.0)
ALK PHOS: 68 U/L (ref 38–126)
ALT: 55 U/L (ref 17–63)
ANION GAP: 8 (ref 5–15)
AST: 43 U/L — ABNORMAL HIGH (ref 15–41)
BILIRUBIN TOTAL: 1.6 mg/dL — AB (ref 0.3–1.2)
BUN: 12 mg/dL (ref 6–20)
CALCIUM: 9.1 mg/dL (ref 8.9–10.3)
CO2: 26 mmol/L (ref 22–32)
CREATININE: 0.89 mg/dL (ref 0.61–1.24)
Chloride: 103 mmol/L (ref 101–111)
GFR calc non Af Amer: >60 mL/min (ref >60)
GLUCOSE: 88 mg/dL (ref 65–99)
Potassium: 4.8 mmol/L (ref 3.5–5.1)
Sodium: 137 mmol/L (ref 135–145)
TOTAL PROTEIN: 7.5 g/dL (ref 6.5–8.1)

## 2016-08-26 LAB — RAPID URINE DRUG SCREEN, HOSP PERFORMED
Amphetamines: NOT DETECTED
BARBITURATES: NOT DETECTED
Benzodiazepines: NOT DETECTED
COCAINE: POSITIVE — AB
Opiates: NOT DETECTED
Tetrahydrocannabinol: POSITIVE — AB

## 2016-08-26 LAB — SALICYLATE LEVEL: Salicylate Lvl: <7 mg/dL (ref 2.8–30.0)

## 2016-08-26 LAB — ETHANOL: Alcohol, Ethyl (B): <5 mg/dL (ref <5)

## 2016-08-26 LAB — ACETAMINOPHEN LEVEL

## 2016-08-26 MED ORDER — ACETAMINOPHEN 325 MG PO TABS: 650.0000 mg | ORAL_TABLET | ORAL | Status: DC | PRN

## 2016-08-26 MED ORDER — SODIUM CHLORIDE 0.9 % IV BOLUS (SEPSIS)
1000.0000 mL | Freq: Once | INTRAVENOUS | Status: AC
  Administered 2016-08-26: 1000 mL via INTRAVENOUS

## 2016-08-26 MED ORDER — TETRACAINE HCL 0.5 % OP SOLN
2.0000 [drp] | Freq: Once | OPHTHALMIC | Status: AC
  Administered 2016-08-26: 2 [drp] via OPHTHALMIC
  Filled 2016-08-26: qty 4

## 2016-08-26 MED ORDER — FLUORESCEIN SODIUM 0.6 MG OP STRP
1.0000 | ORAL_STRIP | Freq: Once | OPHTHALMIC | Status: AC
  Administered 2016-08-26: 1 via OPHTHALMIC
  Filled 2016-08-26: qty 1

## 2016-08-26 MED ORDER — LORAZEPAM 1 MG PO TABS
2.0000 mg | ORAL_TABLET | Freq: Once | ORAL | Status: AC
  Administered 2016-08-26: 2 mg via ORAL
  Filled 2016-08-26: qty 2

## 2016-08-26 MED ORDER — ACETAMINOPHEN 325 MG PO TABS: 650.0000 mg | ORAL_TABLET | Freq: Once | ORAL | Status: DC

## 2016-08-26 NOTE — ED Notes (Addendum)
Pt refused tylenol stating "I'm allergic to tylenol.  I need something stronger than that.  I need to go , my ride is in the waiting room."  Informed pt, mother has gone home d/t having to work in the a.m.

## 2016-08-26 NOTE — ED Notes (Signed)
Report to Jon, RN, to assume care of patient at this time.  

## 2016-08-26 NOTE — ED Triage Notes (Signed)
Pt to ED reports auditory hallucination. When asked whata re the voices telling him , he said " all kind of crazy stuff". Pt requesting fluids. He also sts "I am crazy". Pt in tears and appears very confused. Pt sts first he was SI then said "never mind".

## 2016-08-26 NOTE — BH Assessment (Addendum)
Tele Assessment Note   William PinaJohn H [name] is an 21 y.o. male, who [facility]. Pt was a poor historian with an altered mental status. Pt reported, "I was trippin', my mind wasn't right, just a whole bunch of stuff going on in my head."  Pt reported, "I see a lot of stuff since I was born." Pt reported, seeing something dark a few minutes ago. Pt reported, "I don't like talking that much." Pt reported, "I don't want to hurt myself." Pt denied, SI, HI and self-injurous behaviors. Clinician observed pt loose focus throughout the assessment and stare out until the clinician reprompt the pt to answer the question.   Pt reported, "I used a lot of stuff." Per pt's chart, pt's UDS is positive for marijuana and cocaine. Clinician was unable to assess: pt's history of abuse, previous inpatient admissions. Pt denied being linked to OPT resources (medication management and/or counseling).   Pt presented quiet/awake in scrubs with logical/coherent speech. Pt's eye contact was poor. Pt's mood was preoccupied. Pt's affect was congruent with mood. Pt's thought process was circumstantial. Pt's judgement was impaired. Pt's concentration, insight and impulse control are poor. Pt was oriented x3 (year, city and state). Pt appeared to be responding to internal stimuli. Pt [facility], [facility] could [facility] for safety. Clinician was unable to assess if the pt would sign in voluntarily for inpatient treatment, if recommened.   Diagnosis: Unspecified schizophrenia spectrum and other psychotic disorder.   Past Medical History:  Past Medical History:  Diagnosis Date  . Schizophrenia [facility](HCC)     Past Surgical History:  Procedure Laterality Date  . CHEST TUBE INSERTION  2016    Family History: History reviewed. No pertinent family history.  Social History:  reports that he has been smoking Cigarettes.  He has a 2.00 pack-year smoking history. He has never used smokeless  tobacco. He reports that he drinks alcohol. He reports that he uses drugs, including Marijuana, Benzodiazepines, Opium, and Cocaine.  Additional Social History:  Alcohol / Drug Use Pain Medications: See MAR Prescriptions: See MAR Over the Counter: See MAR History of alcohol / drug use?: Yes Substance #1 Name of Substance 1: Marijuana 1 - Age of First Use: UTA 1 - Amount (size/oz): Per pt's chart, pt's UDS is postive for marijuana. 1 - Frequency: UTA 1 - Duration: UTA 1 - Last Use / Amount: Pt reported, using a lot of stuff a couple days ago.  Substance #2 Name of Substance 2: Cocaine 2 - Age of First Use: UTA 2 - Amount (size/oz): Per pt's chart, pt's UDS is postive for cocaine.  2 - Frequency: UTA 2 - Duration: UTA 2 - Last Use / Amount: Pt reported, using a lot of stuff a couple days ago.   CIWA: CIWA-Ar BP: 123/79 Pulse Rate: 98 COWS:    PATIENT STRENGTHS: (choose at least two) Average or above average intelligence General fund of knowledge  Allergies:  Allergies  Allergen Reactions  . Hydrocodone Itching  . Ibuprofen Hives and Nausea And Vomiting  . Tramadol Hives and Nausea And Vomiting    Home Medications:  (Not in a hospital admission)  OB/GYN Status:  No LMP for male patient.  General Assessment Data Location of Assessment: [facility] TTS Assessment: In system Is this a Tele or Face-to-Face Assessment?: Face-to-Face Is this an Initial Assessment or a Re-assessment for this encounter?: Initial Assessment Marital status: Single Is patient pregnant?: No Pregnancy Status: No Living Arrangements:  Other (Comment) (Homeless) Can pt return to current living arrangement?: Yes Admission Status: Voluntary Is patient capable of signing voluntary admission?: Yes Referral Source: Self/Family/Friend Insurance type: Self-pay     Crisis Care Plan Living Arrangements: Other (Comment) (Homeless) Legal Guardian: Other: (UTA, pt reported, not wanting to answer the  question.) Name of Psychiatrist: NA Name of Therapist: NA  Education Status Is patient currently in school?: No Current Grade: NA Highest grade of school patient has completed: 11th grade Name of school: NA Contact person: NA  Risk to self with the past 6 months Suicidal Ideation: No Has patient been a risk to self within the past 6 months prior to admission? :  (UTA) Suicidal Intent: No Has patient had any suicidal intent within the past 6 months prior to admission? :  (UTA) Is patient at risk for suicide?: No Suicidal Plan?: No Has patient had any suicidal plan within the past 6 months prior to admission? :  (UTA) Access to Means:  (UTA) What has been your use of drugs/alcohol within the last 12 months?: UDS is positive for marijuana and cocaine. Previous Attempts/Gestures:  (UTA) Other Self Harm Risks: UTA Triggers for Past Attempts: Unknown Intentional Self Injurious Behavior:  (Pt denies. ) Family Suicide History: Unable to assess Recent stressful life event(s): Other (Comment) (UTA) Persecutory voices/beliefs?: No Depression:  (UTA) Substance abuse history and/or treatment for substance abuse?: Yes Suicide prevention information given to non-admitted patients: Not applicable  Risk to Others within the past 6 months Homicidal Ideation: No (Pt denies. ) Does patient have any lifetime risk of violence toward others beyond the six months prior to admission? : Unknown Thoughts of Harm to Others: No (Pt denies.) Current Homicidal Intent: No (Pt denies. ) Current Homicidal Plan:  (Pt denies. ) Access to Homicidal Means: No (Pt denies. ) Identified Victim: NA History of harm to others?: No (Pt denies. ) Assessment of Violence: None Noted (Pt denies. ) Violent Behavior Description: NA Does patient have access to weapons?: No (Pt denies. ) Criminal Charges Pending?: No Does patient have a court date: No Is patient on probation?: No  Psychosis Hallucinations: Auditory,  Visual Delusions: Unspecified  Mental Status Report Appearance/Hygiene: In scrubs, Disheveled Eye Contact: Poor Motor Activity: Unremarkable Speech: Logical/coherent Level of Consciousness: Quiet/awake Mood: Preoccupied Affect: Other (Comment) (congruent with mood. ) Anxiety Level: Minimal Thought Processes: Circumstantial Judgement: Impaired Orientation: Other (Comment) (year, city and state.) Obsessive Compulsive Thoughts/Behaviors: None  Cognitive Functioning Concentration: Poor Memory: Recent Impaired IQ: Average Insight: Poor Impulse Control: Poor Appetite:  (UTA) Sleep: Unable to Assess Vegetative Symptoms: Unable to Assess  ADLScreening [facility]) Patient's cognitive ability adequate to safely complete daily activities?: Yes Patient able to express need for assistance with ADLs?: Yes Independently performs ADLs?: Yes (appropriate for developmental age)  Prior Inpatient Therapy Prior Inpatient Therapy:  (UTA) Prior Therapy Dates: UTA Prior Therapy Facilty/Provider(s): UTA Reason for Treatment: UTA  Prior Outpatient Therapy Prior Outpatient Therapy: No Prior Therapy Dates: NA Prior Therapy Facilty/Provider(s): NA Reason for Treatment: NA Does patient have an ACCT team?: Unknown Does patient have Intensive In-House Services?  : Unknown Does patient have [facility] services? : Unknown Does patient have P4CC services?: Unknown  ADL Screening (condition at time of admission) Patient's cognitive ability adequate to safely complete daily activities?: Yes Is the patient deaf or have difficulty hearing?: No Does the patient have difficulty seeing, even when wearing glasses/contacts?: No Does the patient have difficulty concentrating, remembering, or making decisions?: Yes Patient able  to express need for assistance with ADLs?: Yes Does the patient have difficulty dressing or bathing?: No Independently performs ADLs?: Yes (appropriate for developmental  age) Does the patient have difficulty walking or climbing stairs?: No Weakness of Legs: None Weakness of Arms/Hands: None       Abuse/Neglect Assessment (Assessment to be complete while patient is alone) Physical Abuse:  (UTA) Verbal Abuse:  (UTA) Sexual Abuse:  (UTA) Exploitation of patient/patient's resources:  (UTA) Self-Neglect:  (UTA)     Advance Directives (For Healthcare) Does Patient Have a Medical Advance Directive?: No Would patient like information on creating a medical advance directive?: No - Patient declined    Additional Information 1:1 In Past 12 Months?: No CIRT Risk: No Elopement Risk: No Does patient have medical clearance?: Yes     Disposition: William Conn, NP recommends AM Psychiatric Evaluation. Disposition was Dr. Freida Busman and William Athens, RN.   Disposition Initial Assessment Completed for this Encounter: Yes Disposition of Patient: Other dispositions (Pending NP review.) Other disposition(s): Other (Comment) (Pending NP review. )  William [name] 08/26/2016 10:04 PM   William Passe, MS, [facility], [facility] Triage Specialist [phone]
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
## 2016-08-26 NOTE — ED Notes (Signed)
Patient transported to CT 

## 2016-08-27 DIAGNOSIS — R443 Hallucinations, unspecified: Secondary | ICD-10-CM | POA: Diagnosis not present

## 2016-08-27 DIAGNOSIS — Z888 Allergy status to other drugs, medicaments and biological substances status: Secondary | ICD-10-CM

## 2016-08-27 DIAGNOSIS — F1490 Cocaine use, unspecified, uncomplicated: Secondary | ICD-10-CM

## 2016-08-27 DIAGNOSIS — F1390 Sedative, hypnotic, or anxiolytic use, unspecified, uncomplicated: Secondary | ICD-10-CM

## 2016-08-27 DIAGNOSIS — F17210 Nicotine dependence, cigarettes, uncomplicated: Secondary | ICD-10-CM | POA: Diagnosis not present

## 2016-08-27 DIAGNOSIS — F1414 Cocaine abuse with cocaine-induced mood disorder: Secondary | ICD-10-CM

## 2016-08-27 DIAGNOSIS — Z79899 Other long term (current) drug therapy: Secondary | ICD-10-CM

## 2016-08-27 DIAGNOSIS — F1590 Other stimulant use, unspecified, uncomplicated: Secondary | ICD-10-CM

## 2016-08-27 DIAGNOSIS — F1290 Cannabis use, unspecified, uncomplicated: Secondary | ICD-10-CM

## 2016-08-27 LAB — CK TOTAL AND CKMB (NOT AT ARMC)
CK, MB: 1.1 ng/mL (ref 0.5–5.0)
Relative Index: INVALID (ref 0.0–2.5)
Total CK: 52 U/L (ref 49–397)

## 2016-08-27 MED ORDER — NICOTINE 21 MG/24HR TD PT24
21.0000 mg | MEDICATED_PATCH | Freq: Once | TRANSDERMAL | Status: DC
  Administered 2016-08-27: 21 mg via TRANSDERMAL
  Filled 2016-08-27: qty 1

## 2016-08-27 NOTE — ED Notes (Signed)
Pt inquired again about leaving stating "I'm fine, I told that other Doc that I'm all good now, where is the doctor because I'm not staying, I promise you that." awaiting dr review of pt.

## 2016-08-27 NOTE — Discharge Instructions (Signed)
To help you maintain a sober lifestyle, a substance abuse treatment program may be beneficial to you.  Contact one of the following facilities at your earliest opportunity to ask about enrolling: ° °RESIDENTIAL PROGRAMS: ° °     [facility] °     [address] °     [place], [place] [address] °     [phone] ° °     [facility] °     [address] °     [place], [place] [address] °     [phone] ° °     [facility] °     [address] °     [place], [place] [address] °     [phone] ° °OUTPATIENT PROGRAMS: ° °     [facility] °     [address] °     [place], [place] [address] °     [phone] °     New patients are seen at the walk-in clinic every Tuesday from 9:00 am - 12:00 pm. °
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
## 2016-08-27 NOTE — ED Notes (Signed)
Pt discharged home. Discharged instructions read to pt who verbalized understanding. All belongings returned to pt who signed for same. Denies SI/HI, is not delusional and not responding to internal stimuli. Escorted pt to the ED exit.    

## 2016-08-27 NOTE — ED Notes (Signed)
Pt removed all tape & dressing covering SL.

## 2016-08-27 NOTE — ED Notes (Signed)
Pt stating "they call me Chief.  [place].  Is there a moon outside?"  Informed yes.  Pt stated "I'm good.  All I need was water and you gave me that."

## 2016-08-27 NOTE — BH Assessment (Signed)
[facility] Assessment Progress Note  Per Mojeed Akintayo, MD, this pt does not require psychiatric hospitalization at this time.  Pt is to be discharged from [facility] with referral information for area substance abuse treatment providers.  This has been included in pt's discharge instructions.  Pt's nurse, Diane, has been notified.  William Manard, MA Triage Specialist [phone]     

## 2016-08-27 NOTE — ED Notes (Signed)
SBAR Report received from previous nurse. Pt received active on unit. Pt refused to give meaningful answers when asked assessment questions related to current SI/ HI, A/V H, depression, anxiety, or pain at this time, and appears otherwise stable and free of distress but pt displaying disorganized thinking. Pt reminded of camera surveillance, q 15 min rounds, and rules of the milieu. Will continue to assess. Pt provided with a sandwich and drink. Pt requesting to leave because he cannot smoke in the facility. Pt provided with nicotine patch and encouraged to stay voluntarily. EDP called due to pt requesting to leave.

## 2016-08-28 ENCOUNTER — Ambulatory Visit (HOSPITAL_COMMUNITY)
Admission: RE | Admit: 2016-08-28 | Discharge: 2016-08-28 | Disposition: A | Discharge status: Home / Self Care | Payer: Federal, State, Local not specified - Other | Attending: Psychiatry | Admitting: Psychiatry

## 2016-08-28 ENCOUNTER — Encounter (HOSPITAL_COMMUNITY): Payer: Self-pay

## 2016-08-28 DIAGNOSIS — F209 Schizophrenia, unspecified: Secondary | ICD-10-CM | POA: Insufficient documentation

## 2016-08-28 DIAGNOSIS — Z888 Allergy status to other drugs, medicaments and biological substances status: Secondary | ICD-10-CM | POA: Insufficient documentation

## 2016-08-28 DIAGNOSIS — F17210 Nicotine dependence, cigarettes, uncomplicated: Secondary | ICD-10-CM | POA: Insufficient documentation

## 2016-08-28 DIAGNOSIS — F19959 Other psychoactive substance use, unspecified with psychoactive substance-induced psychotic disorder, unspecified: Secondary | ICD-10-CM | POA: Insufficient documentation

## 2016-08-28 DIAGNOSIS — Z885 Allergy status to narcotic agent status: Secondary | ICD-10-CM | POA: Insufficient documentation
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
## 2016-08-28 NOTE — H&P (Signed)
Behavioral Health Medical Screening Exam  William [name] is an 21 y.o. male.  Total Time spent with patient: 15 minutes  Psychiatric Specialty Exam: Physical Exam  Vitals reviewed. Constitutional: He is oriented to person, place, and time. He appears well-developed and well-nourished.  HENT:  Head: Normocephalic.  Eyes: Pupils are equal, round, and reactive to light.  Neck: Normal range of motion.  Cardiovascular: Normal rate and regular rhythm.   Respiratory: Effort normal and breath sounds normal.  GI: Soft.  Musculoskeletal: Normal range of motion.  Neurological: He is alert and oriented to person, place, and time.  Skin: Skin is warm and dry.  Psychiatric: He has a normal mood and affect. His behavior is normal. Judgment and thought content normal.    Review of Systems  Psychiatric/Behavioral: Positive for depression, hallucinations (responding to internal stimuli) and substance abuse. Negative for suicidal ideas. The patient is nervous/anxious.   All other systems reviewed and are negative.   There were no vitals taken for this visit.There is no height or weight on file to calculate BMI.  General Appearance: Casual and Fairly Groomed  Eye Contact:  Fair  Speech:  Clear and Coherent and Normal Rate  Volume:  Normal  Mood:  Anxious and Depressed  Affect:  Appropriate, Congruent and Depressed  Thought Process:  Coherent, Goal Directed, Linear and Descriptions of Associations: Intact  Orientation:  Full (Time, Place, and Person)  Thought Content:  Focused on treatment options, responding to internal stimuli, smiling inappropriately  Suicidal Thoughts:  No  Homicidal Thoughts:  No  Memory:  Immediate;   Fair Recent;   Fair Remote;   Fair  Judgement:  Fair  Insight:  Fair  Psychomotor Activity:  Normal  Concentration: Concentration: Fair and Attention Span: Fair  Recall:  FiservFair  Fund of Knowledge:Fair  Language: Fair  Akathisia:  No  Handed:    AIMS (if indicated):      Assets:  Communication Skills Desire for Improvement Resilience Social Support  Sleep:       Musculoskeletal: Strength & Muscle Tone: within normal limits Gait & Station: normal Patient leans: N/A  There were no vitals taken for this visit.  Recommendations:  Based on my evaluation the patient does not appear to have an emergency medical condition.  Beau FannyWithrow, Nimai C, FNP 08/28/2016, 12:32 PM

## 2016-09-14 ENCOUNTER — Encounter (HOSPITAL_COMMUNITY): Payer: Self-pay

## 2016-09-14 ENCOUNTER — Emergency Department (HOSPITAL_COMMUNITY): Payer: Medicaid Other

## 2016-09-14 ENCOUNTER — Emergency Department (HOSPITAL_COMMUNITY)
Admission: EM | Admit: 2016-09-14 | Discharge: 2016-09-14 | Disposition: A | Discharge status: Home / Self Care | Payer: Medicaid Other | Attending: Emergency Medicine | Admitting: Emergency Medicine

## 2016-09-14 DIAGNOSIS — F17210 Nicotine dependence, cigarettes, uncomplicated: Secondary | ICD-10-CM | POA: Insufficient documentation

## 2016-09-14 DIAGNOSIS — Y939 Activity, unspecified: Secondary | ICD-10-CM | POA: Insufficient documentation

## 2016-09-14 DIAGNOSIS — S63601A Unspecified sprain of right thumb, initial encounter: Secondary | ICD-10-CM | POA: Insufficient documentation

## 2016-09-14 DIAGNOSIS — S60412A Abrasion of right middle finger, initial encounter: Secondary | ICD-10-CM | POA: Insufficient documentation

## 2016-09-14 DIAGNOSIS — Z23 Encounter for immunization: Secondary | ICD-10-CM | POA: Insufficient documentation

## 2016-09-14 DIAGNOSIS — Y929 Unspecified place or not applicable: Secondary | ICD-10-CM | POA: Insufficient documentation

## 2016-09-14 DIAGNOSIS — Y999 Unspecified external cause status: Secondary | ICD-10-CM | POA: Insufficient documentation

## 2016-09-14 DIAGNOSIS — S60414A Abrasion of right ring finger, initial encounter: Secondary | ICD-10-CM | POA: Insufficient documentation

## 2016-09-14 DIAGNOSIS — S60221A Contusion of right hand, initial encounter: Secondary | ICD-10-CM | POA: Insufficient documentation

## 2016-09-14 MED ORDER — OXYCODONE-ACETAMINOPHEN 5-325 MG PO TABS
1.0000 | ORAL_TABLET | Freq: Once | ORAL | Status: AC
  Administered 2016-09-14: 1 via ORAL
  Filled 2016-09-14: qty 1

## 2016-09-14 MED ORDER — TETANUS-DIPHTH-ACELL PERTUSSIS 5-2.5-18.5 LF-MCG/0.5 IM SUSP
0.5000 mL | Freq: Once | INTRAMUSCULAR | Status: AC
  Administered 2016-09-14: 0.5 mL via INTRAMUSCULAR
  Filled 2016-09-14: qty 0.5

## 2016-09-14 MED ORDER — BACITRACIN ZINC 500 UNIT/GM EX OINT
TOPICAL_OINTMENT | Freq: Two times a day (BID) | CUTANEOUS | Status: DC
  Administered 2016-09-14: 1 via TOPICAL
  Filled 2016-09-14: qty 0.9
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
## 2016-09-14 NOTE — Discharge Instructions (Signed)
Take tylenol as needed for pain. Follow up with Dr. Amanda PeaGramig for further evaluation of possible ligament/tendon injury.
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
## 2016-09-14 NOTE — ED Notes (Signed)
Pt stable, understands discharge instructions, and reasons for return.   

## 2016-09-14 NOTE — Progress Notes (Signed)
Orthopedic Tech Progress Note Patient Details:  William PinaJohn H [name] 12/26/1995 [id number]  Ortho Devices Type of Ortho Device: Thumb velcro splint Ortho Device/Splint Location: RUE Ortho Device/Splint Interventions: Ordered, Application   Jennye MoccasinHughes, Deserea Bordley Craig 09/14/2016, 10:46 PM

## 2017-02-16 ENCOUNTER — Emergency Department (HOSPITAL_COMMUNITY)
Admission: EM | Admit: 2017-02-16 | Discharge: 2017-02-17 | Disposition: A | Discharge status: Home / Self Care | Payer: Federal, State, Local not specified - Other | Attending: Emergency Medicine | Admitting: Emergency Medicine

## 2017-02-16 DIAGNOSIS — F209 Schizophrenia, unspecified: Secondary | ICD-10-CM | POA: Insufficient documentation

## 2017-02-16 DIAGNOSIS — F1414 Cocaine abuse with cocaine-induced mood disorder: Secondary | ICD-10-CM | POA: Diagnosis present

## 2017-02-16 DIAGNOSIS — E876 Hypokalemia: Secondary | ICD-10-CM | POA: Insufficient documentation

## 2017-02-16 DIAGNOSIS — F17210 Nicotine dependence, cigarettes, uncomplicated: Secondary | ICD-10-CM | POA: Insufficient documentation

## 2017-02-16 DIAGNOSIS — F99 Mental disorder, not otherwise specified: Secondary | ICD-10-CM | POA: Insufficient documentation

## 2017-02-16 LAB — RAPID URINE DRUG SCREEN, HOSP PERFORMED
AMPHETAMINES: POSITIVE — AB
BARBITURATES: NOT DETECTED
BENZODIAZEPINES: NOT DETECTED
Cocaine: POSITIVE — AB
Opiates: NOT DETECTED
TETRAHYDROCANNABINOL: POSITIVE — AB

## 2017-02-16 LAB — CBC WITH DIFFERENTIAL/PLATELET
Basophils Absolute: 0.1 10*3/uL (ref 0.0–0.1)
Basophils Relative: 1 %
EOS ABS: 0.1 10*3/uL (ref 0.0–0.7)
Eosinophils Relative: 1 %
HCT: 41.9 % (ref 39.0–52.0)
HEMOGLOBIN: 15.6 g/dL (ref 13.0–17.0)
LYMPHS ABS: 1.5 10*3/uL (ref 0.7–4.0)
LYMPHS PCT: 16 %
MCH: 30.2 pg (ref 26.0–34.0)
MCHC: 37.2 g/dL — AB (ref 30.0–36.0)
MCV: 81.2 fL (ref 78.0–100.0)
MONOS PCT: 11 %
Monocytes Absolute: 1 10*3/uL (ref 0.1–1.0)
NEUTROS PCT: 72 %
Neutro Abs: 6.8 10*3/uL (ref 1.7–7.7)
Platelets: 285 10*3/uL (ref 150–400)
RBC: 5.16 MIL/uL (ref 4.22–5.81)
RDW: 11.8 % (ref 11.5–15.5)
WBC: 9.5 10*3/uL (ref 4.0–10.5)

## 2017-02-16 LAB — COMPREHENSIVE METABOLIC PANEL
ALT: 47 U/L (ref 17–63)
ANION GAP: 11 (ref 5–15)
AST: 35 U/L (ref 15–41)
Albumin: 4.2 g/dL (ref 3.5–5.0)
Alkaline Phosphatase: 54 U/L (ref 38–126)
BILIRUBIN TOTAL: 1.4 mg/dL — AB (ref 0.3–1.2)
BUN: 15 mg/dL (ref 6–20)
CHLORIDE: 98 mmol/L — AB (ref 101–111)
CO2: 23 mmol/L (ref 22–32)
Calcium: 9 mg/dL (ref 8.9–10.3)
Creatinine, Ser: 1.18 mg/dL (ref 0.61–1.24)
Glucose, Bld: 94 mg/dL (ref 65–99)
POTASSIUM: 2.6 mmol/L — AB (ref 3.5–5.1)
Sodium: 132 mmol/L — ABNORMAL LOW (ref 135–145)
TOTAL PROTEIN: 7.5 g/dL (ref 6.5–8.1)

## 2017-02-16 LAB — BASIC METABOLIC PANEL
ANION GAP: 8 (ref 5–15)
BUN: 17 mg/dL (ref 6–20)
CO2: 32 mmol/L (ref 22–32)
Calcium: 9.4 mg/dL (ref 8.9–10.3)
Chloride: 99 mmol/L — ABNORMAL LOW (ref 101–111)
Creatinine, Ser: 1.28 mg/dL — ABNORMAL HIGH (ref 0.61–1.24)
Glucose, Bld: 93 mg/dL (ref 65–99)
POTASSIUM: 3.9 mmol/L (ref 3.5–5.1)
Sodium: 139 mmol/L (ref 135–145)

## 2017-02-16 LAB — ACETAMINOPHEN LEVEL

## 2017-02-16 LAB — ETHANOL: Alcohol, Ethyl (B): <5 mg/dL (ref <5)

## 2017-02-16 LAB — SALICYLATE LEVEL

## 2017-02-16 MED ORDER — LORAZEPAM 2 MG/ML IJ SOLN: 1.0000 mg | Freq: Two times a day (BID) | INTRAMUSCULAR | Status: DC

## 2017-02-16 MED ORDER — POTASSIUM CHLORIDE CRYS ER 20 MEQ PO TBCR
60.0000 meq | EXTENDED_RELEASE_TABLET | Freq: Once | ORAL | Status: AC
  Administered 2017-02-16: 60 meq via ORAL
  Filled 2017-02-16: qty 3

## 2017-02-16 MED ORDER — HALOPERIDOL LACTATE 5 MG/ML IJ SOLN
2.0000 mg | Freq: Once | INTRAMUSCULAR | Status: AC
  Administered 2017-02-16: 2 mg via INTRAMUSCULAR
  Filled 2017-02-16: qty 1

## 2017-02-16 MED ORDER — HALOPERIDOL 5 MG PO TABS
5.0000 mg | ORAL_TABLET | Freq: Two times a day (BID) | ORAL | Status: DC
  Administered 2017-02-17: 5 mg via ORAL
  Filled 2017-02-16: qty 1

## 2017-02-16 MED ORDER — HALOPERIDOL LACTATE 5 MG/ML IJ SOLN: 5.0000 mg | Freq: Two times a day (BID) | INTRAMUSCULAR | Status: DC

## 2017-02-16 MED ORDER — LORAZEPAM 1 MG PO TABS
1.0000 mg | ORAL_TABLET | Freq: Two times a day (BID) | ORAL | Status: DC
  Administered 2017-02-17: 1 mg via ORAL
  Filled 2017-02-16: qty 1
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
## 2017-02-16 NOTE — ED Notes (Signed)
Family at bedside. 

## 2017-02-16 NOTE — ED Provider Notes (Signed)
At shift change was asked by Sharilyn Sites PA-C to follow up on his potassium.  Plan was to orally replace his potassium with 60 Meq K-Dur CR once and repeat one hour after with repeat BMP one hour after second dose of potassium.  Orders were placed accordingly.  Repeat labs showed potassium has risen from 2.6 to 3.9.  No further potassium needed at this time.       Norman Clay 02/16/17 2155    Gerhard Munch, MD 02/20/17 2337
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
## 2017-02-16 NOTE — ED Notes (Signed)
Phlebotomy called for lab draw 

## 2017-02-16 NOTE — ED Notes (Signed)
Bed: [facility] Expected date:  Expected time:  Means of arrival:  Comments: Room 30

## 2017-02-16 NOTE — ED Notes (Signed)
Pt refusing to change into scrubs. Security and [employer] at bedside explaining to pt that he is IVC and needs to change per policy. After pt was advised that security would have to change his clothes for him, pt agreed to change clothes but continues to shout obscenities to staff and security. Pt is verbally abusive and belligerent

## 2017-02-16 NOTE — BH Assessment (Signed)
[facility] Assessment Progress Note   Case was staffed with Lord DNP who recommended patient be re-evaluated in the a.m.    

## 2017-02-16 NOTE — ED Triage Notes (Signed)
Pt does not want to come through triage doors. Pt arguing with family that he wants to leave, then he wants to ask the staff questions, but refuses to ask the question. ED PA called to triage and is speaking with PA and security.

## 2017-02-16 NOTE — ED Notes (Signed)
SBAR Report received from previous nurse. Pt received calm and visible on unit. Pt denies current SI/ HI, A/V H, depression, anxiety, or pain at this time, and appears otherwise stable and free of distress. Pt reminded of camera surveillance, q 15 min rounds, and rules of the milieu. Will continue to assess. 

## 2017-02-16 NOTE — ED Notes (Signed)
Pt states he has little recollection of this afternoon' admission.  Is not forthcoming with precipitating reason for behavior.  States "I'm better now".  POC explained and 15' checks initiated.

## 2017-02-17 DIAGNOSIS — Z79899 Other long term (current) drug therapy: Secondary | ICD-10-CM

## 2017-02-17 DIAGNOSIS — F1510 Other stimulant abuse, uncomplicated: Secondary | ICD-10-CM | POA: Diagnosis not present

## 2017-02-17 DIAGNOSIS — F17210 Nicotine dependence, cigarettes, uncomplicated: Secondary | ICD-10-CM

## 2017-02-17 DIAGNOSIS — F1414 Cocaine abuse with cocaine-induced mood disorder: Secondary | ICD-10-CM
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
## 2017-02-17 NOTE — ED Notes (Signed)
Pt d/c home per MD order. Discharge summary reviewed with pt. Pt verbalizes understanding. Pt denies SI/HI/AVH. Pt signed for personal property and property returned. Pt signed e-signature. Ambulatory off unit.  

## 2017-02-17 NOTE — Discharge Instructions (Signed)
For your ongoing mental health needs, you are advised to follow up [facility]. ° °[facility] °[address] °[place], [place] [address] °[phone] °New patients are seen at the walk-in clinic every Tuesday from 9:00 am - 12:00 pm. °
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
## 2017-03-18 ENCOUNTER — Emergency Department (HOSPITAL_COMMUNITY)
Admission: EM | Admit: 2017-03-18 | Discharge: 2017-03-20 | Disposition: A | Discharge status: Home / Self Care | Payer: Self-pay | Attending: Emergency Medicine | Admitting: Emergency Medicine

## 2017-03-18 ENCOUNTER — Encounter (HOSPITAL_COMMUNITY): Payer: Self-pay | Admitting: Emergency Medicine

## 2017-03-18 DIAGNOSIS — F17210 Nicotine dependence, cigarettes, uncomplicated: Secondary | ICD-10-CM | POA: Insufficient documentation

## 2017-03-18 DIAGNOSIS — F1414 Cocaine abuse with cocaine-induced mood disorder: Secondary | ICD-10-CM | POA: Diagnosis present

## 2017-03-18 DIAGNOSIS — Z79899 Other long term (current) drug therapy: Secondary | ICD-10-CM | POA: Insufficient documentation

## 2017-03-18 DIAGNOSIS — F1920 Other psychoactive substance dependence, uncomplicated: Secondary | ICD-10-CM | POA: Diagnosis present

## 2017-03-18 DIAGNOSIS — F1910 Other psychoactive substance abuse, uncomplicated: Secondary | ICD-10-CM

## 2017-03-18 DIAGNOSIS — F19950 Other psychoactive substance use, unspecified with psychoactive substance-induced psychotic disorder with delusions: Secondary | ICD-10-CM | POA: Diagnosis present

## 2017-03-18 DIAGNOSIS — F606 Avoidant personality disorder: Secondary | ICD-10-CM | POA: Insufficient documentation

## 2017-03-18 LAB — COMPREHENSIVE METABOLIC PANEL
ALT: 55 U/L (ref 17–63)
ANION GAP: 11 (ref 5–15)
AST: 34 U/L (ref 15–41)
Albumin: 4.3 g/dL (ref 3.5–5.0)
Alkaline Phosphatase: 77 U/L (ref 38–126)
BUN: 15 mg/dL (ref 6–20)
CHLORIDE: 104 mmol/L (ref 101–111)
CO2: 24 mmol/L (ref 22–32)
CREATININE: 1.21 mg/dL (ref 0.61–1.24)
Calcium: 9.1 mg/dL (ref 8.9–10.3)
GFR calc Af Amer: >60 mL/min (ref >60)
Glucose, Bld: 84 mg/dL (ref 65–99)
Potassium: 3.8 mmol/L (ref 3.5–5.1)
Sodium: 139 mmol/L (ref 135–145)
Total Bilirubin: 1.4 mg/dL — ABNORMAL HIGH (ref 0.3–1.2)
Total Protein: 7.8 g/dL (ref 6.5–8.1)

## 2017-03-18 LAB — CBC WITH DIFFERENTIAL/PLATELET
BASOS PCT: 1 %
Basophils Absolute: 0.1 10*3/uL (ref 0.0–0.1)
EOS ABS: 0.1 10*3/uL (ref 0.0–0.7)
Eosinophils Relative: 2 %
HEMATOCRIT: 49.7 % (ref 39.0–52.0)
HEMOGLOBIN: 17.5 g/dL — AB (ref 13.0–17.0)
Lymphocytes Relative: 21 %
Lymphs Abs: 1.8 10*3/uL (ref 0.7–4.0)
MCH: 30.3 pg (ref 26.0–34.0)
MCHC: 35.2 g/dL (ref 30.0–36.0)
MCV: 86 fL (ref 78.0–100.0)
MONOS PCT: 8 %
Monocytes Absolute: 0.6 10*3/uL (ref 0.1–1.0)
NEUTROS ABS: 5.8 10*3/uL (ref 1.7–7.7)
NEUTROS PCT: 68 %
Platelets: 237 10*3/uL (ref 150–400)
RBC: 5.78 MIL/uL (ref 4.22–5.81)
RDW: 13.2 % (ref 11.5–15.5)
WBC: 8.4 10*3/uL (ref 4.0–10.5)

## 2017-03-18 LAB — URINALYSIS, ROUTINE W REFLEX MICROSCOPIC
Bilirubin Urine: NEGATIVE
Glucose, UA: NEGATIVE mg/dL
HGB URINE DIPSTICK: NEGATIVE
Ketones, ur: 20 mg/dL — AB
LEUKOCYTES UA: NEGATIVE
NITRITE: NEGATIVE
PROTEIN: NEGATIVE mg/dL
SPECIFIC GRAVITY, URINE: 1.025 (ref 1.005–1.030)
pH: 6 (ref 5.0–8.0)

## 2017-03-18 LAB — RAPID URINE DRUG SCREEN, HOSP PERFORMED
AMPHETAMINES: POSITIVE — AB
BENZODIAZEPINES: POSITIVE — AB
Barbiturates: NOT DETECTED
Cocaine: POSITIVE — AB
OPIATES: NOT DETECTED
Tetrahydrocannabinol: POSITIVE — AB

## 2017-03-18 LAB — ETHANOL

## 2017-03-18 LAB — ACETAMINOPHEN LEVEL

## 2017-03-18 LAB — SALICYLATE LEVEL

## 2017-03-18 MED ORDER — VITAMIN B-1 100 MG PO TABS: 100.0000 mg | ORAL_TABLET | Freq: Every day | ORAL | Status: DC

## 2017-03-18 MED ORDER — NICOTINE 21 MG/24HR TD PT24
21.0000 mg | MEDICATED_PATCH | Freq: Every day | TRANSDERMAL | Status: DC
  Filled 2017-03-18: qty 1

## 2017-03-18 MED ORDER — ZOLPIDEM TARTRATE 5 MG PO TABS: 5.0000 mg | ORAL_TABLET | Freq: Every evening | ORAL | Status: DC | PRN

## 2017-03-18 MED ORDER — LORAZEPAM 2 MG/ML IJ SOLN
2.0000 mg | Freq: Once | INTRAMUSCULAR | Status: AC
  Administered 2017-03-18: 2 mg via INTRAMUSCULAR

## 2017-03-18 MED ORDER — THIAMINE HCL 100 MG/ML IJ SOLN: 100.0000 mg | Freq: Every day | INTRAMUSCULAR | Status: DC

## 2017-03-18 MED ORDER — LORAZEPAM 1 MG PO TABS: 0.0000 mg | ORAL_TABLET | Freq: Two times a day (BID) | ORAL | Status: DC

## 2017-03-18 MED ORDER — ZIPRASIDONE MESYLATE 20 MG IM SOLR
20.0000 mg | INTRAMUSCULAR | Status: AC | PRN
  Administered 2017-03-18: 20 mg via INTRAMUSCULAR
  Filled 2017-03-18: qty 20

## 2017-03-18 MED ORDER — LORAZEPAM 2 MG/ML IJ SOLN
1.0000 mg | Freq: Once | INTRAMUSCULAR | Status: AC
  Administered 2017-03-18: 1 mg via INTRAMUSCULAR
  Filled 2017-03-18: qty 1

## 2017-03-18 MED ORDER — LORAZEPAM 2 MG/ML IJ SOLN: 0.0000 mg | Freq: Two times a day (BID) | INTRAMUSCULAR | Status: DC

## 2017-03-18 MED ORDER — LORAZEPAM 2 MG/ML IJ SOLN: 0.0000 mg | Freq: Four times a day (QID) | INTRAMUSCULAR | Status: DC

## 2017-03-18 MED ORDER — RISPERIDONE 1 MG PO TBDP
2.0000 mg | ORAL_TABLET | Freq: Three times a day (TID) | ORAL | Status: DC | PRN
  Filled 2017-03-18: qty 2

## 2017-03-18 MED ORDER — LORAZEPAM 2 MG/ML IJ SOLN
2.0000 mg | Freq: Once | INTRAMUSCULAR | Status: DC
  Filled 2017-03-18: qty 1

## 2017-03-18 MED ORDER — ACETAMINOPHEN 325 MG PO TABS: 650.0000 mg | ORAL_TABLET | ORAL | Status: DC | PRN

## 2017-03-18 MED ORDER — ONDANSETRON HCL 4 MG PO TABS: 4.0000 mg | ORAL_TABLET | Freq: Three times a day (TID) | ORAL | Status: DC | PRN

## 2017-03-18 MED ORDER — LORAZEPAM 1 MG PO TABS
0.0000 mg | ORAL_TABLET | Freq: Four times a day (QID) | ORAL | Status: DC
  Administered 2017-03-18 – 2017-03-19 (×2): 2 mg via ORAL
  Filled 2017-03-18 (×2): qty 2

## 2017-03-18 MED ORDER — SODIUM CHLORIDE 0.9 % IV BOLUS (SEPSIS)
1000.0000 mL | Freq: Once | INTRAVENOUS | Status: AC
  Administered 2017-03-18: 1000 mL via INTRAVENOUS

## 2017-03-18 MED ORDER — ALUM & MAG HYDROXIDE-SIMETH 200-200-20 MG/5ML PO SUSP: 30.0000 mL | Freq: Four times a day (QID) | ORAL | Status: DC | PRN

## 2017-03-18 NOTE — ED Notes (Signed)
When pt was given food tray, he made a remark saying, "It would be good to get some heroin."

## 2017-03-18 NOTE — ED Notes (Signed)
Tried to stick pt 2x for IV. Pt kept moving arm each time when I stuck.
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
## 2017-03-18 NOTE — ED Notes (Signed)
Patient is aware a urine specimen is needed. Patient will notify staff when able to void.

## 2017-03-18 NOTE — ED Notes (Signed)
As RN was administering medication, patient asked if he could have some "herion" instead of the medication given. Patient stated "I got some earlier from here".
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
## 2017-03-18 NOTE — ED Notes (Signed)
Patient pulled out IV.

## 2017-03-18 NOTE — [facility] Counselor (Signed)
Clinician left a HIPPA compliant voicemail to pt's mother who initiated the IVC paperwork.   Redmond Pulling, MS, [facility], [facility] Triage Specialist [phone]

## 2017-03-18 NOTE — ED Notes (Signed)
Bed: WA17 Expected date:  Expected time:  Means of arrival:  Comments: hold 
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
## 2017-03-19 DIAGNOSIS — F19950 Other psychoactive substance use, unspecified with psychoactive substance-induced psychotic disorder with delusions: Secondary | ICD-10-CM | POA: Diagnosis present

## 2017-03-19 MED ORDER — ZIPRASIDONE MESYLATE 20 MG IM SOLR
20.0000 mg | Freq: Once | INTRAMUSCULAR | Status: AC
  Administered 2017-03-19: 20 mg via INTRAMUSCULAR
  Filled 2017-03-19: qty 20

## 2017-03-19 MED ORDER — HALOPERIDOL 2 MG PO TABS
2.0000 mg | ORAL_TABLET | Freq: Two times a day (BID) | ORAL | Status: DC
  Administered 2017-03-19 – 2017-03-20 (×3): 2 mg via ORAL
  Filled 2017-03-19 (×3): qty 1

## 2017-03-19 MED ORDER — GABAPENTIN 300 MG PO CAPS
300.0000 mg | ORAL_CAPSULE | Freq: Three times a day (TID) | ORAL | Status: DC
  Administered 2017-03-19 – 2017-03-20 (×3): 300 mg via ORAL
  Filled 2017-03-19 (×3): qty 1

## 2017-03-19 MED ORDER — DIPHENHYDRAMINE HCL 50 MG/ML IJ SOLN
50.0000 mg | Freq: Once | INTRAMUSCULAR | Status: AC
  Administered 2017-03-19: 50 mg via INTRAMUSCULAR
  Filled 2017-03-19: qty 1

## 2017-03-19 MED ORDER — STERILE WATER FOR INJECTION IJ SOLN
INTRAMUSCULAR | Status: AC
  Administered 2017-03-19: 11:00:00
  Filled 2017-03-19: qty 10

## 2017-03-19 MED ORDER — LORAZEPAM 2 MG/ML IJ SOLN
2.0000 mg | Freq: Once | INTRAMUSCULAR | Status: AC
  Administered 2017-03-19: 2 mg via INTRAMUSCULAR
  Filled 2017-03-19: qty 1

## 2017-03-19 MED ORDER — HYDROXYZINE HCL 25 MG PO TABS
25.0000 mg | ORAL_TABLET | Freq: Two times a day (BID) | ORAL | Status: DC
  Administered 2017-03-19 – 2017-03-20 (×3): 25 mg via ORAL
  Filled 2017-03-19 (×3): qty 1

## 2017-03-19 NOTE — BH Assessment (Addendum)
Assessment Note  William [name] is an 21 y.o. male., who presents involuntary and unaccompanied to [facility]. Clinician asked the William [name], "what brought you to the hospital?" William [name] replied, "my [name], William [name]." Clinician called William name numerous times to re-engage in assessment. Clinician noted from RN William [name] was chemically restrained, due to aggressive behaviors.   William [name] was IVC'd by William [name]. Clinician left HIPPA complaint voice message to William [name]. Per IVC paperwork: "He is a danger to harm himself and/or others. Paranoia schizophrenia. Does drugs some (coke last couple of days). Or maybe once a week. Committed March 2018-Behavioral Health. Jumping from furniture to furniture because thinks snakes on the floor. Hearing voices. Tried to pluck William eye out because of the voices. Thinks everyone is out to harm him. Very erratic behavior. While riding in the car with William [name] tried to pull the steering wheel saying don't look to the left or don't straight."   Clinician was unable to assess the following: marital status, OPT resources, SI, HI, AVH, self -injurious behaviors, access to weapons, history of abuse, legal involvement, orientation, contract for safety, mood, and affect.   William [name] presents sedated in scrubs with slurred, soft speech. William eye contact was poor. William thought process was irrelevant. William judgement was impaired. William concentration, insight and impulse control are poor.   Diagnosis: Schizophrenia [facility])   Past Medical History:  Past Medical History:  Diagnosis Date  . Schizophrenia [facility])     Past Surgical History:  Procedure Laterality Date  . CHEST TUBE INSERTION  2016    [name] History: History reviewed. No pertinent [name] history.  Social History:  reports that he has been smoking Cigarettes.  He has a 2.00 pack-year smoking history. He has never used smokeless tobacco. He reports that he drinks alcohol. He reports that he uses drugs, including Marijuana, Benzodiazepines, Opium, and  Cocaine.  Additional Social History:  Alcohol / Drug Use Pain Medications: See MAR Prescriptions: See MAR Over the Counter: See MAR History of alcohol / drug use?: Yes Substance #1 Name of Substance 1: Amphetamines 1 - Age of First Use: UTA 1 - Amount (size/oz): William UDS is positive for amphetamines.  1 - Frequency: UTA 1 - Duration: UTA 1 - Last Use / Amount: UTA Substance #2 Name of Substance 2: Cocaine 2 - Age of First Use: UTA 2 - Amount (size/oz): William UDS is postive for cocaine. 2 - Frequency: UTA 2 - Duration: UTA 2 - Last Use / Amount: UTA Substance #3 Name of Substance 3: Benzodiazepines.  3 - Age of First Use: UTA 3 - Amount (size/oz): William UDS is positive for benzodiazepines.  3 - Frequency: UTA 3 - Duration: UTA 3 - Last Use / Amount: UTA Substance #4 Name of Substance 4: Marijuana 4 - Age of First Use: UTA 4 - Amount (size/oz): William UDS is positive marijuana.  4 - Frequency: UTA 4 - Duration: UTA 4 - Last Use / Amount: UTA  CIWA: CIWA-Ar BP: 125/84 Pulse Rate: (!) 110 COWS:    Allergies:  Allergies  Allergen Reactions  . Hydrocodone Itching    William [name] states that William [name] is not allergic to hydrocodone  . Ibuprofen Hives and Nausea And Vomiting  . Tramadol Hives and Nausea And Vomiting    Home Medications:  (Not in a hospital admission)  OB/GYN Status:  No LMP for male patient.  General Assessment Data Location of Assessment: [facility] TTS Assessment: In system Is this a Tele or Face-to-Face Assessment?:  Face-to-Face Is this an Initial Assessment or a Re-assessment for this encounter?: Initial Assessment Marital status: Single Living Arrangements: Alone (Per chart.) Can William [name] return to current living arrangement?: Yes Admission Status: Involuntary Referral Source: Other (GPD.) Insurance type: Self-pay     Crisis Care Plan Living Arrangements: Alone (Per chart.) Legal Guardian: Other: (Self) Name of Psychiatrist: UTA Name of Therapist:  UTA  Education Status Is patient currently in school?:  (UTA) Current Grade: UTA Highest grade of school patient has completed: UTA Name of school: UTA Contact person: UTA  Risk to self with the past 6 months Suicidal Ideation:  (UTA) Has patient been a risk to self within the past 6 months prior to admission? :  (UTA) Suicidal Intent:  (UTA) Has patient had any suicidal intent within the past 6 months prior to admission? :  (UTA) Is patient at risk for suicide?:  (UTA) Suicidal Plan?:  (UTA) Has patient had any suicidal plan within the past 6 months prior to admission? :  (UTA) Access to Means:  (UTA) What has been your use of drugs/alcohol within the last 12 months?: Marijuana, cocaine, amphetamines, and beznidiazepines.  Previous Attempts/Gestures:  (UTA) How many times?:  (UTA) Other Self Harm Risks: UTA Triggers for Past Attempts:  (UTA) Intentional Self Injurious Behavior:  (UTA) [name] Suicide History:  (UTA) Recent stressful life event(s):  (UTA) Persecutory voices/beliefs?:  Rich Reining) Depression:  (UTA) Depression Symptoms:  (UTA) Substance abuse history and/or treatment for substance abuse?: Yes Suicide prevention information given to non-admitted patients:  (UTA)  Risk to Others within the past 6 months Homicidal Ideation:  (UTA) Does patient have any lifetime risk of violence toward others beyond the six months prior to admission? : Yes (comment) (Per chart assault threats to staff/[name]. ) Thoughts of Harm to Others: No-Not Currently Present/Within Last 6 Months (UTA) Comment - Thoughts of Harm to Others: Per chart assault threats to staff/[name].  Current Homicidal Intent:  (UTA) Current Homicidal Plan:  (UTA) Access to Homicidal Means:  (UTA) Identified Victim: Per chart, to staff/[name].  History of harm to others?:  (UTA) Assessment of Violence: On admission (Per chart. ) Violent Behavior Description: Per chart assault threats to staff/[name].  Does patient  have access to weapons?:  (UTA) Criminal Charges Pending?:  (UTA) Does patient have a court date:  (UTA) Is patient on probation?:  (UTA)  Psychosis Hallucinations: Auditory, Visual (Per IVC ) Delusions: Unspecified (Per IVC. )  Mental Status Report Appearance/Hygiene: In scrubs Eye Contact: Poor Motor Activity: Unremarkable Speech: Slurred, Soft Level of Consciousness: Sedated Mood: Other (Comment) (UTA) Affect: Unable to Assess Anxiety Level:  (UTA) Thought Processes: Irrelevant Judgement: Impaired Orientation: Not oriented Obsessive Compulsive Thoughts/Behaviors: Unable to Assess  Cognitive Functioning Concentration: Poor Memory: Recent Impaired IQ: Average Insight: Poor Impulse Control: Poor Appetite:  (UTA) Sleep:  (UTA) Vegetative Symptoms: Unable to Assess  ADLScreening [facility]) Patient's cognitive ability adequate to safely complete daily activities?: Yes Patient able to express need for assistance with ADLs?: Yes Independently performs ADLs?: Yes (appropriate for developmental age)  Prior Inpatient Therapy Prior Inpatient Therapy: Yes (Per chart. ) Prior Therapy Dates: 2018 (Per chart. ) Prior Therapy Facilty/Provider(s): [facility] (Per chart. ) Reason for Treatment: MH issues SA issues (Per chart. )  Prior Outpatient Therapy Prior Outpatient Therapy: No (Per chart.) Prior Therapy Dates: NA Prior Therapy Facilty/Provider(s): NA Reason for Treatment: NA Does patient have an ACCT team?: Unknown Does patient have Intensive In-House Services?  : Unknown Does patient have [employer]  services? : Unknown Does patient have P4CC services?: Unknown  ADL Screening (condition at time of admission) Patient's cognitive ability adequate to safely complete daily activities?: Yes Is the patient deaf or have difficulty hearing?: No (Per chart.) Does the patient have difficulty seeing, even when wearing glasses/contacts?: No (Per chart.) Does the patient have  difficulty concentrating, remembering, or making decisions?: Yes (Per chart.) Patient able to express need for assistance with ADLs?: Yes Does the patient have difficulty dressing or bathing?: No (Per chart. ) Independently performs ADLs?: Yes (appropriate for developmental age) Does the patient have difficulty walking or climbing stairs?: No (Per chart.) Weakness of Legs: None (Per chart. ) Weakness of Arms/Hands: None (Per chart. )       Abuse/Neglect Assessment (Assessment to be complete while patient is alone) Physical Abuse:  (UTA) Verbal Abuse:  (UTA) Sexual Abuse:  (UTA) Exploitation of patient/patient's resources:  (UTA) Self-Neglect:  (UTA)     Advance Directives (For Healthcare) Does Patient Have a Medical Advance Directive?: No Would patient like information on creating a medical advance directive?: No - Patient declined    Additional Information 1:1 In Past 12 Months?: No CIRT Risk: No Elopement Risk: No Does patient have medical clearance?: Yes     Disposition: Donell Sievert, PA recommends inpatient treatment. Disposition discussed with [place], [place] and  [place], Charity fundraiser. TTS to seek placement.    Disposition Initial Assessment Completed for this Encounter: Yes Disposition of Patient: Inpatient treatment program Type of inpatient treatment program: Adult  On Site Evaluation by:   Reviewed with Physician:  Donell Sievert, PA  Holly Bodily Raimi Guillermo 03/19/2017 12:53 AM   Redmond Pulling, MS, [facility], [facility] Triage Specialist [phone]

## 2017-03-19 NOTE — BH Assessment (Signed)
[facility] Assessment Progress Note  Per Mojeed Akintayo, this pt requires psychiatric hospitalization at this time.  Pt presents under IVC initiated by pt's mother, which Dr Jannifer Franklin has upheld.  The following facilities have been contacted to seek placement for this pt, with results as noted:  Beds available, information sent, decision pending:  [facility]   At capacity:  [facility], [place] Triage Specialist [phone]

## 2017-03-19 NOTE — ED Notes (Signed)
Pt is angry, agitated, going into other patient's rooms and argumentative with staff. He does not understand that he is being admitted to a hospital for further treatment, and does not agree with that plan. He keeps telling his mother to come and get him, to meet him out front, but will not give staff permission to talk with her. He believes that his mother has the authority to get him out of here and will not believe anything else.

## 2017-03-19 NOTE — ED Notes (Signed)
Pt asleep for vital signs. Will obtain when patient is awake. RN is aware.

## 2017-03-19 NOTE — Progress Notes (Signed)
03/19/17 1356:  LRT went to pt room to offer activities, pt was sleep.  Caroll Rancher, LRT/CTRS

## 2017-03-19 NOTE — ED Notes (Signed)
Pt A&O x 3, no distress noted at present. Cooperative but agitated, requesting to leave facility.  Monitoring for safety, Q 15 min checks in effect.

## 2017-03-19 NOTE — Patient Outreach (Signed)
ED Peer Support Specialist Patient Intake (Complete at intake & 30-60 Day Follow-up)  Name: William [name]  MRN: [id number]  Age: 21 y.o.   Date of Admission: 03/19/2017  Intake: Initial Comments: Patient does not want any services or referrals for substance use treatment at this time.     Primary Reason Admitted: The patient was admitted due to paranoia, schizophrenia, and poly substance use  Lab values: Alcohol/ETOH: Negative Positive UDS? Yes Amphetamines: Yes Barbiturates: No Benzodiazepines: Yes Cocaine: Yes Opiates: No Cannabinoids: Yes  Demographic information: Gender: Male Ethnicity: Latino Marital Status: Single Insurance Status: Medicaid Control and instrumentation engineer (Work Engineer, agricultural, Sales executive, etc.: No Lives with: Alone Living situation: House/Apartment  Reported Patient History: Patient reported health conditions: Schizophrenia Patient aware of HIV and hepatitis status: No  In past year, has patient visited ED for any reason? Yes  Number of ED visits: 2  Reason(s) for visit: sprained right thumb, hallucinations and cocaine use  In past year, has patient been hospitalized for any reason? No  Number of hospitalizations:    Reason(s) for hospitalization:    In past year, has patient been arrested? Yes  Number of arrests: 3  Reason(s) for arrest: gun charge, heroin posession, and assault   In past year, has patient been incarcerated? No  Number of incarcerations:    Reason(s) for incarceration:    In past year, has patient received medication-assisted treatment? No  In past year, patient received the following treatments: Other (comment) ([facility] for mental health treatment)  In past year, has patient received any harm reduction services? No  Did this include any of the following?    In past year, has patient received care from a mental health provider for diagnosis other than SUD? Yes ([facility] for mental health treatment)  In  past year, is this first time patient has overdosed? No  Number of past overdoses: 0  In past year, is this first time patient has been hospitalized for an overdose? No  Number of hospitalizations for overdose(s): 0  Is patient currently receiving treatment for a mental health diagnosis? No  Patient reports experiencing difficulty participating in SUD treatment: No    Most important reason(s) for this difficulty?  (Patient does not want services for substance use treatment at this time)  Has patient received prior services for treatment? Unsure  In past, patient has received services from following agencies:    Plan of Care:  Suggested follow up at these agencies/treatment centers:  [name] and I informed the patient about avaiable substance use treatment services. Patient does not want substance use treamtent services at this time. )  Other information:    Praneeth Bussey, CPSS  03/19/2017 6:56 PM

## 2017-03-20 DIAGNOSIS — F1210 Cannabis abuse, uncomplicated: Secondary | ICD-10-CM

## 2017-03-20 DIAGNOSIS — F17210 Nicotine dependence, cigarettes, uncomplicated: Secondary | ICD-10-CM

## 2017-03-20 DIAGNOSIS — F1110 Opioid abuse, uncomplicated: Secondary | ICD-10-CM

## 2017-03-20 DIAGNOSIS — F1310 Sedative, hypnotic or anxiolytic abuse, uncomplicated: Secondary | ICD-10-CM

## 2017-03-20 DIAGNOSIS — F19950 Other psychoactive substance use, unspecified with psychoactive substance-induced psychotic disorder with delusions: Secondary | ICD-10-CM

## 2017-03-20 DIAGNOSIS — F1414 Cocaine abuse with cocaine-induced mood disorder: Secondary | ICD-10-CM

## 2017-03-20 MED ORDER — HALOPERIDOL 2 MG PO TABS: 2.0000 mg | ORAL_TABLET | Freq: Two times a day (BID) | ORAL | 0 refills | Status: AC

## 2017-03-20 MED ORDER — GABAPENTIN 300 MG PO CAPS: 300.0000 mg | ORAL_CAPSULE | Freq: Three times a day (TID) | ORAL | 0 refills | Status: AC

## 2017-03-20 MED ORDER — HYDROXYZINE HCL 25 MG PO TABS: 25.0000 mg | ORAL_TABLET | Freq: Two times a day (BID) | ORAL | 0 refills | Status: AC

## 2017-03-20 NOTE — BH Assessment (Signed)
[facility] Assessment Progress Note  Per Thedore Mins, MD, this pt does not require psychiatric hospitalization at this time.  Pt presents under IVC initiated by his mother.  Dr Jannifer Franklin initially upheld petition, but has now rescinded it.  Pt is to be discharged from [facility] with referral information for area substance abuse treatment providers.  This has been included in pt's discharge instructions.  Peer Support Specialists will also be asked to see pt prior to discharge.  Pt's nurse, Kendal Hymen, has been notified.  Doylene Canning, MA Triage Specialist [phone]

## 2017-03-20 NOTE — ED Notes (Addendum)
Pt discharged safely with resources.  Peer review was consulted but patient was discharged before they arrived per NP.  Pt was in no distress at discharge.  All belongings were returned to patient.  Pt had no shoes so we gave him flip flops. Prescriptions were given and reviewed.
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
## 2017-03-20 NOTE — Discharge Instructions (Signed)
To help you maintain a sober lifestyle, a substance abuse treatment program may be beneficial to you.  Contact one of the following facilities at your earliest opportunity to ask about enrolling: ° °RESIDENTIAL PROGRAMS: ° °     [facility] °     [address] °     [place], [place] [address] °     [phone] ° °     [facility] °     [address] °     [place], [place] [address] °     [phone] ° °     [facility] °     [address] °     [place], [place] [address] °     [phone] ° °OUTPATIENT PROGRAMS: ° °     [facility] °     [address] °     [place], [place] [address] °     [phone] °     New patients are seen at the walk-in clinic every Tuesday from 9:00 am - 12:00 pm °
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
